# Patient Record
Sex: Female | Born: 1965 | Race: White | Hispanic: No | Smoking: Current every day smoker
Health system: Southern US, Community
[De-identification: ages and names within clinical notes are randomized; demographics above are authoritative.]

## PROBLEM LIST (undated history)

## (undated) DIAGNOSIS — F32A Depression, unspecified: Secondary | ICD-10-CM

## (undated) DIAGNOSIS — N200 Calculus of kidney: Secondary | ICD-10-CM

## (undated) DIAGNOSIS — F329 Major depressive disorder, single episode, unspecified: Secondary | ICD-10-CM

## (undated) DIAGNOSIS — Z9049 Acquired absence of other specified parts of digestive tract: Secondary | ICD-10-CM

## (undated) DIAGNOSIS — F909 Attention-deficit hyperactivity disorder, unspecified type: Secondary | ICD-10-CM

## (undated) HISTORY — PX: ABDOMINAL HYSTERECTOMY: SHX81

## (undated) HISTORY — PX: CHOLECYSTECTOMY: SHX55

---

## 2005-05-20 ENCOUNTER — Emergency Department (HOSPITAL_COMMUNITY): Admission: EM | Admit: 2005-05-20 | Discharge: 2005-05-20 | Payer: Self-pay | Admitting: Family Medicine

## 2010-11-20 ENCOUNTER — Observation Stay (HOSPITAL_COMMUNITY)
Admission: EM | Admit: 2010-11-20 | Discharge: 2010-11-22 | Disposition: A | Discharge status: Home / Self Care | Payer: Managed Care, Other (non HMO) | Source: Ambulatory Visit | Attending: Internal Medicine | Admitting: Internal Medicine

## 2010-11-20 ENCOUNTER — Inpatient Hospital Stay (INDEPENDENT_AMBULATORY_CARE_PROVIDER_SITE_OTHER)
Admission: RE | Admit: 2010-11-20 | Discharge: 2010-11-20 | Disposition: A | Discharge status: Home / Self Care | Payer: Managed Care, Other (non HMO) | Source: Ambulatory Visit | Attending: Family Medicine | Admitting: Family Medicine

## 2010-11-20 DIAGNOSIS — F411 Generalized anxiety disorder: Secondary | ICD-10-CM | POA: Insufficient documentation

## 2010-11-20 DIAGNOSIS — F3289 Other specified depressive episodes: Secondary | ICD-10-CM | POA: Insufficient documentation

## 2010-11-20 DIAGNOSIS — F329 Major depressive disorder, single episode, unspecified: Secondary | ICD-10-CM | POA: Insufficient documentation

## 2010-11-20 DIAGNOSIS — K219 Gastro-esophageal reflux disease without esophagitis: Secondary | ICD-10-CM | POA: Insufficient documentation

## 2010-11-20 DIAGNOSIS — F17200 Nicotine dependence, unspecified, uncomplicated: Secondary | ICD-10-CM | POA: Insufficient documentation

## 2010-11-20 DIAGNOSIS — E876 Hypokalemia: Secondary | ICD-10-CM | POA: Insufficient documentation

## 2010-11-20 DIAGNOSIS — R109 Unspecified abdominal pain: Secondary | ICD-10-CM | POA: Insufficient documentation

## 2010-11-20 DIAGNOSIS — R0789 Other chest pain: Principal | ICD-10-CM | POA: Insufficient documentation

## 2010-11-20 DIAGNOSIS — R079 Chest pain, unspecified: Secondary | ICD-10-CM

## 2010-11-20 DIAGNOSIS — Z23 Encounter for immunization: Secondary | ICD-10-CM | POA: Insufficient documentation

## 2010-11-20 DIAGNOSIS — E785 Hyperlipidemia, unspecified: Secondary | ICD-10-CM | POA: Insufficient documentation

## 2010-11-20 DIAGNOSIS — R51 Headache: Secondary | ICD-10-CM | POA: Insufficient documentation

## 2010-11-20 DIAGNOSIS — R0602 Shortness of breath: Secondary | ICD-10-CM | POA: Insufficient documentation

## 2010-11-20 DIAGNOSIS — Z87442 Personal history of urinary calculi: Secondary | ICD-10-CM | POA: Insufficient documentation

## 2010-11-20 HISTORY — DX: Acquired absence of other specified parts of digestive tract: Z90.49

## 2010-11-20 LAB — D-DIMER, QUANTITATIVE: D-Dimer, Quant: 0.24 ug/mL-FEU (ref 0.00–0.48)

## 2010-11-20 LAB — COMPREHENSIVE METABOLIC PANEL
ALT: 22 U/L (ref 0–35)
AST: 27 U/L (ref 0–37)
Albumin: 3.8 g/dL (ref 3.5–5.2)
Alkaline Phosphatase: 99 U/L (ref 39–117)
CO2: 24 mEq/L (ref 19–32)
Chloride: 99 mEq/L (ref 96–112)
Creatinine, Ser: 0.7 mg/dL (ref 0.50–1.10)
GFR calc non Af Amer: >90 mL/min (ref >90)
Potassium: 3.6 mEq/L (ref 3.5–5.1)
Sodium: 135 mEq/L (ref 135–145)
Total Bilirubin: 0.4 mg/dL (ref 0.3–1.2)

## 2010-11-20 LAB — URINALYSIS, ROUTINE W REFLEX MICROSCOPIC
Ketones, ur: NEGATIVE mg/dL
Nitrite: NEGATIVE
Protein, ur: 30 mg/dL — AB
Urobilinogen, UA: 0.2 mg/dL (ref 0.0–1.0)
pH: 5.5 (ref 5.0–8.0)

## 2010-11-20 LAB — POCT I-STAT TROPONIN I: Troponin i, poc: 0.01 ng/mL (ref 0.00–0.08)

## 2010-11-20 LAB — CBC
Hemoglobin: 14.5 g/dL (ref 12.0–15.0)
MCH: 33.6 pg (ref 26.0–34.0)
MCHC: 36.4 g/dL — ABNORMAL HIGH (ref 30.0–36.0)
Platelets: 326 10*3/uL (ref 150–400)
RBC: 4.31 MIL/uL (ref 3.87–5.11)

## 2010-11-20 LAB — DIFFERENTIAL
Basophils Absolute: 0 10*3/uL (ref 0.0–0.1)
Basophils Relative: 0 % (ref 0–1)
Eosinophils Absolute: 0.1 10*3/uL (ref 0.0–0.7)
Monocytes Absolute: 0.7 10*3/uL (ref 0.1–1.0)
Neutro Abs: 6.9 10*3/uL (ref 1.7–7.7)
Neutrophils Relative %: 63 % (ref 43–77)

## 2010-11-20 LAB — POCT I-STAT, CHEM 8
BUN: <3 mg/dL — ABNORMAL LOW (ref 6–23)
Calcium, Ion: 1.06 mmol/L — ABNORMAL LOW (ref 1.12–1.32)
Chloride: 102 mEq/L (ref 96–112)
Creatinine, Ser: 0.7 mg/dL (ref 0.50–1.10)
Glucose, Bld: 99 mg/dL (ref 70–99)
HCT: 44 % (ref 36.0–46.0)
Hemoglobin: 15 g/dL (ref 12.0–15.0)
Potassium: 3.5 mEq/L (ref 3.5–5.1)
Sodium: 137 mEq/L (ref 135–145)
TCO2: 24 mmol/L (ref 0–100)

## 2010-11-20 LAB — URINE MICROSCOPIC-ADD ON

## 2010-11-20 LAB — TROPONIN I: Troponin I: <0.3 ng/mL (ref <0.30)

## 2010-11-21 ENCOUNTER — Inpatient Hospital Stay (HOSPITAL_COMMUNITY): Payer: Managed Care, Other (non HMO)

## 2010-11-21 ENCOUNTER — Encounter (HOSPITAL_COMMUNITY): Payer: Self-pay | Admitting: Radiology

## 2010-11-21 LAB — COMPREHENSIVE METABOLIC PANEL
Alkaline Phosphatase: 105 U/L (ref 39–117)
BUN: 6 mg/dL (ref 6–23)
Chloride: 99 mEq/L (ref 96–112)
Creatinine, Ser: 0.64 mg/dL (ref 0.50–1.10)
GFR calc Af Amer: >90 mL/min (ref >90)
GFR calc non Af Amer: >90 mL/min (ref >90)
Glucose, Bld: 111 mg/dL — ABNORMAL HIGH (ref 70–99)
Potassium: 3.2 mEq/L — ABNORMAL LOW (ref 3.5–5.1)
Total Bilirubin: 0.3 mg/dL (ref 0.3–1.2)

## 2010-11-21 LAB — LIPID PANEL
Cholesterol: 269 mg/dL — ABNORMAL HIGH (ref 0–200)
Triglycerides: 223 mg/dL — ABNORMAL HIGH (ref <150)

## 2010-11-21 LAB — CBC
HCT: 39.2 % (ref 36.0–46.0)
Hemoglobin: 14 g/dL (ref 12.0–15.0)
MCHC: 35.7 g/dL (ref 30.0–36.0)
MCV: 93.3 fL (ref 78.0–100.0)
WBC: 10.5 10*3/uL (ref 4.0–10.5)

## 2010-11-21 LAB — CARDIAC PANEL(CRET KIN+CKTOT+MB+TROPI)
CK, MB: 1.5 ng/mL (ref 0.3–4.0)
Troponin I: <0.3 ng/mL (ref <0.30)

## 2010-11-21 LAB — DIFFERENTIAL
Basophils Absolute: 0 10*3/uL (ref 0.0–0.1)
Lymphocytes Relative: 34 % (ref 12–46)
Lymphs Abs: 3.6 10*3/uL (ref 0.7–4.0)
Monocytes Absolute: 0.7 10*3/uL (ref 0.1–1.0)
Neutro Abs: 6 10*3/uL (ref 1.7–7.7)

## 2010-11-21 LAB — HEMOGLOBIN A1C
Hgb A1c MFr Bld: 5.6 % (ref <5.7)
Mean Plasma Glucose: 114 mg/dL (ref <117)

## 2010-11-21 LAB — PRO B NATRIURETIC PEPTIDE: Pro B Natriuretic peptide (BNP): 57.2 pg/mL (ref 0–125)

## 2010-11-21 LAB — MAGNESIUM: Magnesium: 1.7 mg/dL (ref 1.5–2.5)

## 2010-11-22 LAB — BASIC METABOLIC PANEL
BUN: 3 mg/dL — ABNORMAL LOW (ref 6–23)
Chloride: 103 mEq/L (ref 96–112)
GFR calc Af Amer: >90 mL/min (ref >90)
GFR calc non Af Amer: >90 mL/min (ref >90)
Potassium: 3.7 mEq/L (ref 3.5–5.1)

## 2010-11-22 LAB — T4, FREE: Free T4: 1.14 ng/dL (ref 0.80–1.80)

## 2010-11-22 LAB — URINE CULTURE: Culture  Setup Time: 201210081053

## 2010-11-26 NOTE — H&P (Signed)
NAME:  Mia Smith, Mia Smith NO.:  [id number]  MEDICAL RECORD NO.:  [id number]  LOCATION:                                 FACILITY:  PHYSICIAN:  Lonia Blood, M.D.      DATE OF BIRTH:  December 17, 1965  DATE OF ADMISSION: DATE OF DISCHARGE:                             HISTORY & PHYSICAL   PRIMARY CARE PHYSICIAN:  In [place], she is unassigned to [place].  PRESENTING COMPLAINT:  Chest pain.  HISTORY OF PRESENT ILLNESS:  The patient is a 45 year old female who presented with sudden loss of left-sided chest pain associated with nausea and weakness.  She was apparently working in [employer] today when it happened.  It was sharp, centrally located.  Associated with some right flank pain that is persistent.  The patient had prior history of kidney stones and initially thought she was having another kidney stone attack.  She denied any shortness of breath.  Denied any orthopnea.  No PND.  Denied any hemoptysis.  She denied any diarrhea.  Her chest pain was relieved in the ED with nitro.  When she arrived to the ED, she was apparently having chest pain that is she rated as 8/10 but currently down to 0/10.  She could not identify specific aggravating factor but is relieved again with nitro.  She is currently having headaches from the nitro.  PAST MEDICAL HISTORY:  Significant for migraine headaches, depression.  ALLERGIES:  PENICILLIN.  MEDICATIONS:  Cymbalta and Vyvanse.  SOCIAL HISTORY:  She is married and lives in [place], [place]. Denied any alcohol or IV drug use.  The patient smokes about one and half pack per day.  No prior history of hypertension or coronary artery disease.  FAMILY HISTORY:  No history of early coronary artery disease.  REVIEW OF SYSTEMS:  All systems reviewed are negative except per HPI.  PHYSICAL EXAMINATION:  VITAL SIGNS:  She is afebrile with a temperature 97.6, her blood pressure is 136/77, pulse 106, respiratory rate 16, and saturation  99% on room air. GENERAL:  She is awake, alert, oriented, slightly overweight woman who is in no acute distress. HEENT:  PERRL.  EOMI.  No significant pallor.  No jaundice.  No rhinorrhea. NECK:  Supple.  No JVD.  No lymphadenopathy. RESPIRATORY:  Good air entry bilaterally.  No significant wheeze.  No crackles.  No rales. CARDIOVASCULAR:  She is tachycardic. ABDOMEN:  Soft, full, nontender with positive bowel sounds. EXTREMITIES:  No edema, cyanosis, or clubbing. SKIN:  No rashes.  No ulcers. MUSCULOSKELETAL:  No significant joint swelling or tenderness.  LABORATORY DATA:  Her white count is 10.8 with normal differential, hemoglobin of 14.5 with platelet count of 326.  Sodium is 135, potassium 3.6, chloride 99, CO2 of 24, glucose 105, BUN 5, creatinine 0.70. Calcium 9.1, albumin 3.8.  The rest of the LFTs within normal.  D-dimer is 0.24.  Urinalysis showed amber cloudy urine with positive leukocyte esterase with some bilirubin, trace blood, and proteinuria.  Urine microscopy showed many squamous epithelial cells with some hyaline casts, wbc's 3-6, and many bacteria.  Initial cardiac enzymes showed a negative troponin.  Her lipase is 28.  Chest x-ray  is currently negative.  EKG showed normal sinus rhythm with a rate of 81, normal intervals, no significant ST-T wave changes.  ASSESSMENT:  This is a 45 year old female presenting with chest pain. Her major risk factors are tobacco smoking and unknown cholesterol status.  PLAN: 1. Chest pain.  Seems atypical.  She has been seen by Dr. Jacinto Halim.     However, at this point, she does not seem to be having acute     coronary syndrome.  We will admit her for rule out.  There is     possibility of also kidney stone.  She had prior history of kidney     stone.  She is having right flank pain that seems spasmodic and     coming around to her groin.  We will work her up effectively then.     In this case, we will do CT scan of the abdomen with  kidney stone     protocol. 2. Tobacco abuse.  The patient is getting nicotine patch as well as     tobacco cessation counseling.  She is interested in getting Chantix     at the time of discharge. 3. Depression.  We will continue with her Cymbalta at her home dose. 4. Headaches.  More than likely from nitroglycerin.  We will treat her     symptomatically. 5. Leukocytosis.  Probably UTI.  I will empirically put her on     Rocephin or rather Cipro if she cannot tolerate Rocephin with her     penicillin allergies.     Lonia Blood, M.D.     Verlin Grills  D:  11/20/2010  T:  11/20/2010  Job:  [id number]  Electronically Signed by Lonia Blood M.D. on 11/26/2010 03:26:01 PM

## 2010-12-07 NOTE — Discharge Summary (Signed)
Mia Smith, Mia Smith              ACCOUNT NO.:  [id number]  MEDICAL RECORD NO.:  [id number]  LOCATION:                                 FACILITY:  PHYSICIAN:  Kathlen Mody, MD       DATE OF BIRTH:  1965-08-30  DATE OF ADMISSION:  11/20/2010 DATE OF DISCHARGE:  11/22/2010                              DISCHARGE SUMMARY   PRIMARY CARE PHYSICIAN:  None.  DISCHARGE DIAGNOSES: 1. Chest pain most likely secondary to anxiety. 2. Depression. 3. Gastroesophageal reflux disease. 4. History of nephrolithiasis. 5. Anxiety. 6. Depression. 7. Tobacco abuse. 8. Hyperlipidemia. 9. Migraine headaches.  DISCHARGE MEDICATIONS: 1. Aspirin 81 mg daily. 2. Nicotine patch. 3. Oxycodone 5 mg every 6 hours as needed. 4. Protonix 40 mg daily. 5. Simvastatin 20 mg daily. 6. Cymbalta 60 mg daily. 7. Vyvanse 30 mg 1 tab daily.  CONSULTATIONS:  Cardiology consult from Dr. Jacinto Halim.  PROCEDURES:  None.  PERTINENT LABS:  The patient had a CBC done which showed an elevated WBC count of 10.8.  Point-of-care troponin negative.  Urinalysis shows small leukocytes and many bacteria.  D-dimer was negative.  Comprehensive metabolic panel significant for glucose of 105, lipase was normal. Troponin negative.  Cardiac enzymes were negative.  Pro BNP was 57. Lipid profile showed an elevated total cholesterol and LDL and low HDL. Free T4 was 1.14.  TSH was 7.124.  Hemoglobin A1c was 5.62.  One sets of cardiac enzymes negative.  Urine culture showed no growth to date.  RADIOLOGY:  The patient had a CT abdomen pelvis without contrast shows mild hepatic steatosis.  Minimal increase in the size of the small left adrenal adenoma.  Two view chest x-ray shows no acute cardiopulmonary disease.  BRIEF HOSPITAL COURSE:  This 45 year old lady with history of anxiety and depression came in with sudden onset of left-sided chest pain.  She was admitted to tele to rule out acute coronary syndrome.  She was also complaining  of right flank pain and a CT abdomen pelvis was done to evaluate for renal stones.  CT abdomen pelvis did not show any kidney stones.  Her tele monitor was within normal limits.  Her enzymes were negative.  A 2D echocardiogram was done, which showed good systolic left ventricular systolic function with no regional wall abnormalities. Possible grade 1 diastolic dysfunction with elevated mildly dilated left atrium.  Hence, she was discharged home to follow with Dr. Jacinto Halim and to follow up the echo results and also possibly recommended to get an outpatient stress test if the patient's chest pain is persistent.  Leukocytosis, she was worked up for urinary tract infection.  She was empirically started on Rocephin and urine cultures came negative.  Her antibiotics were discontinued.  Tobacco abuse.  She was given tobacco cessation counseling.  She was put on nicotine patch.  Initially, she was interested in getting Chantix, but she was not sure if her insurance would cover that, hence she was discharged on nicotine patch.  Depression, no suicidal ideation.  Continue with her Cymbalta.  On the day of discharge, the patient's vitals were within normal limits. Her exam was within normal limits.  She was discharged home  to follow up with her cardiologist for an outpatient stress test and also follow with her PCP for her left adrenal small   DICTATION ENDS AT THIS POINT.          ______________________________ Kathlen Mody, MD     [facility]  D:  12/06/2010  T:  12/06/2010  Job:  [id number]  Electronically Signed by Kathlen Mody MD on 12/07/2010 11:11:51 PM

## 2010-12-09 NOTE — H&P (Signed)
NAMEOLIVIAGRACE, CRISANTI NO.:  [id number]  MEDICAL RECORD NO.:  [id number]  LOCATION:  3731                         FACILITY:  [facility]  PHYSICIAN:  Pamella Pert, MD DATE OF BIRTH:  [birth year]  DATE OF ADMISSION:  11/20/2010 DATE OF DISCHARGE:                             HISTORY & PHYSICAL   ADMISSION DIAGNOSES: 1. Chest pain, rule out unstable angina. 2. Depression. 3. Gastroesophageal reflux disease. 4. History of nephrolithiasis. 5. Anxiety and depression. 6. Tobacco use disorder. 7. Hyperlipidemia.  HISTORY:  Ms. Stephane Junkins is a 45 year old female who woke up this morning, not feeling well, and felt nauseous all day.  This afternoon, she went to a mall around 3 o'clock.  At that time, she felt a heaviness in the middle of her chest.  It was continuous.  It worsen with taking deep breaths.  However, because of continued at this chest discomfort, she presented to the emergency room.  She describes chest discomfort as pressure-like sensation in the middle of her chest.  She also describes pain in the upper middle part of her abdomen.  She has had nausea all day.  No associated diaphoresis or palpitations.  No hemoptysis.  No shortness of breath, although while sitting and waiting in the emergency department she did feel a little bit short of breath.  She also complains of lower back pain.  It is in the middle of her back below the scapula.  She states it is continuous.  She also complains of upper abdominal discomfort.  She states it is continuous.  REVIEW OF SYSTEMS:  She is not a diabetic.  There is no bowel or bladder distress except for feeling nausea.  She is not firm.  There is no history of syncope, although she has occasionally felt dizzy this all day today.  No hemoptysis.  No symptoms suggestive of TIA.  She now has developed severe headache, but no significant change in her chest pain with nitroglycerin.  Other systems are  negative.  HOME MEDICATION:  Cymbalta 60 mg p.o. daily for depression.  She also takes Vyvanse for ADHD.  ALLERGIES:  PENICILLIN as a child.  She was told that she swells up.  PAST SURGICAL HISTORY:  C-section in 1998, hysterectomy in 2004, and cholecystectomy in 2003.  SOCIAL HISTORY:  She is married, smokes about a pack and half to two packs of cigarettes per day.  Does not exercise up.  She has been under extreme stress.  No history of alcohol or illicit drug use.  FAMILY HISTORY:  There is no history of premature coronary artery disease or diabetes in the family.  PHYSICAL EXAMINATION:  GENERAL:  She is moderately built and obese.  She appears to be in no acute distress. VITAL SIGNS:  Heart rate of 98 beats per minute, respirations 14, blood pressure 127/76. CARDIAC:  S1 and S2 is normal without any gallop or murmur. CHEST:  Clear. ABDOMEN:  Soft and obese.  There is mild epigastric tenderness present. BACK:  Examination of the spine revealed mild tenderness in the lower part of her back at T12-L1 level. NEUROLOGIC:  She was intact without any gross deficits.  Peripheral arterial examination was  normal with equal pulses bilaterally.  EKG demonstrates normal sinus rhythm with nonspecific T-wave changes. Her BMP was within normal limits except for mildly elevated blood sugar 105.  BUN of 5, creatinine 0.7.  Bilirubin was normal.  Lipase was normal.  Her urinalysis was within normal limits except for occasional wbc's and many bacteria.  She had trace hemoglobin in her urine.  Troponins were negative x1 along with D-dimer which is negative.  CBC was within normal limits.  The patient admitted the hospital overnight for observation.  I also obtained a urinalysis as she has had abdominal discomfort, nausea, vomiting, and this could be all nonspecific symptoms of urinary tract infection.  She may also have significant GERD and that may be giving her chest discomfort.  She has  history of urolithiasis and that she had passed stones sometime back in the past.  She felt that she may be having another stone about 10 days ago but she felt that she probably passed the stone.  Further recommendations,  we will follow.  Defer cardiac markers are negative and she is asymptomatic.  She is ruled out for myocardial infarction and even go home in the morning and do outpatient evaluation for cardiac risk ratification.  I have discussed with her regarding smoking cessation.     Pamella Pert, MD     JR[facility]/MEDQ  D:  11/20/2010  T:  11/21/2010  Job:  [id number]  cc:   Keturah Barre, MD  Electronically Signed by Yates Decamp MD on 12/09/2010 05:48:34 PM

## 2012-04-17 ENCOUNTER — Emergency Department (HOSPITAL_BASED_OUTPATIENT_CLINIC_OR_DEPARTMENT_OTHER)
Admission: EM | Admit: 2012-04-17 | Discharge: 2012-04-17 | Disposition: A | Discharge status: Home / Self Care | Payer: BC Managed Care – PPO | Attending: Emergency Medicine | Admitting: Emergency Medicine

## 2012-04-17 ENCOUNTER — Encounter (HOSPITAL_BASED_OUTPATIENT_CLINIC_OR_DEPARTMENT_OTHER): Payer: Self-pay | Admitting: *Deleted

## 2012-04-17 ENCOUNTER — Emergency Department (HOSPITAL_BASED_OUTPATIENT_CLINIC_OR_DEPARTMENT_OTHER): Payer: BC Managed Care – PPO

## 2012-04-17 DIAGNOSIS — R110 Nausea: Secondary | ICD-10-CM | POA: Insufficient documentation

## 2012-04-17 DIAGNOSIS — Z87442 Personal history of urinary calculi: Secondary | ICD-10-CM | POA: Insufficient documentation

## 2012-04-17 DIAGNOSIS — F3289 Other specified depressive episodes: Secondary | ICD-10-CM | POA: Insufficient documentation

## 2012-04-17 DIAGNOSIS — R109 Unspecified abdominal pain: Secondary | ICD-10-CM

## 2012-04-17 DIAGNOSIS — Z87440 Personal history of urinary (tract) infections: Secondary | ICD-10-CM | POA: Insufficient documentation

## 2012-04-17 DIAGNOSIS — F909 Attention-deficit hyperactivity disorder, unspecified type: Secondary | ICD-10-CM | POA: Insufficient documentation

## 2012-04-17 DIAGNOSIS — R310 Gross hematuria: Secondary | ICD-10-CM | POA: Insufficient documentation

## 2012-04-17 DIAGNOSIS — F329 Major depressive disorder, single episode, unspecified: Secondary | ICD-10-CM | POA: Insufficient documentation

## 2012-04-17 DIAGNOSIS — F17200 Nicotine dependence, unspecified, uncomplicated: Secondary | ICD-10-CM | POA: Insufficient documentation

## 2012-04-17 DIAGNOSIS — Z90710 Acquired absence of both cervix and uterus: Secondary | ICD-10-CM | POA: Insufficient documentation

## 2012-04-17 DIAGNOSIS — Z9089 Acquired absence of other organs: Secondary | ICD-10-CM | POA: Insufficient documentation

## 2012-04-17 DIAGNOSIS — Z79899 Other long term (current) drug therapy: Secondary | ICD-10-CM | POA: Insufficient documentation

## 2012-04-17 HISTORY — DX: Attention-deficit hyperactivity disorder, unspecified type: F90.9

## 2012-04-17 HISTORY — DX: Depression, unspecified: F32.A

## 2012-04-17 HISTORY — DX: Calculus of kidney: N20.0

## 2012-04-17 HISTORY — DX: Major depressive disorder, single episode, unspecified: F32.9

## 2012-04-17 LAB — URINE MICROSCOPIC-ADD ON

## 2012-04-17 LAB — URINALYSIS, ROUTINE W REFLEX MICROSCOPIC
Bilirubin Urine: NEGATIVE
Glucose, UA: NEGATIVE mg/dL
Leukocytes, UA: NEGATIVE
Nitrite: NEGATIVE
Specific Gravity, Urine: 1.004 — ABNORMAL LOW (ref 1.005–1.030)
pH: 6 (ref 5.0–8.0)

## 2012-04-17 MED ORDER — SODIUM CHLORIDE 0.9 % IV SOLN
INTRAVENOUS | Status: DC
  Administered 2012-04-17: 02:00:00 via INTRAVENOUS

## 2012-04-17 MED ORDER — KETOROLAC TROMETHAMINE 15 MG/ML IJ SOLN
15.0000 mg | Freq: Once | INTRAMUSCULAR | Status: AC
  Administered 2012-04-17: 15 mg via INTRAVENOUS
  Filled 2012-04-17: qty 1

## 2012-04-17 MED ORDER — PROMETHAZINE HCL 25 MG/ML IJ SOLN
12.5000 mg | Freq: Once | INTRAMUSCULAR | Status: AC
  Administered 2012-04-17: 12.5 mg via INTRAVENOUS
  Filled 2012-04-17: qty 1

## 2012-04-17 MED ORDER — HYDROMORPHONE HCL PF 1 MG/ML IJ SOLN
1.0000 mg | Freq: Once | INTRAMUSCULAR | Status: AC
  Administered 2012-04-17: 1 mg via INTRAVENOUS
  Filled 2012-04-17: qty 1

## 2012-04-17 MED ORDER — ONDANSETRON HCL 4 MG/2ML IJ SOLN
4.0000 mg | Freq: Once | INTRAMUSCULAR | Status: AC
  Administered 2012-04-17: 4 mg via INTRAVENOUS
  Filled 2012-04-17: qty 2

## 2012-04-17 NOTE — ED Notes (Signed)
dx'd with UTI last week and started on Macrobid but it makes pt sick on her stomach. Pt now c/o right flank pain that radiates to groin area.

## 2012-04-17 NOTE — ED Provider Notes (Signed)
History     CSN: [id number]  Arrival date & time 04/17/12  0118   First MD Initiated Contact with Patient 04/17/12 0200      Chief Complaint  Patient presents with  . Flank Pain    (Consider location/radiation/quality/duration/timing/severity/associated sxs/prior treatment) HPI This is a 46 year old female with a history of urinary tract infections and kidney stones. She was seen by her primary care physician states he does do to right flank pain and gross hematuria which were consistent with prior kidney infections. She was started on Macrobid but had to discontinue this due to to nausea and vomiting. She was then treated with Pyridium but again stopped this due to nausea and vomiting. Yesterday her symptoms worsened and became severe this morning. Her symptoms now are characterized as more like a kidney stone. The pain is located in the right flank radiating around to the right groin. It is not significantly change with movement or palpation. She continues to be nauseated. She is no longer having gross hematuria. She denies fever but has had chills.  Past Medical History  Diagnosis Date  . S/P laparoscopic cholecystectomy   . Kidney stones   . ADHD (attention deficit hyperactivity disorder)   . Depression     Past Surgical History  Procedure Laterality Date  . Cholecystectomy    . Abdominal hysterectomy      History reviewed. No pertinent family history.  History  Substance Use Topics  . Smoking status: Current Every Day Smoker  . Smokeless tobacco: Not on file  . Alcohol Use: No    OB History   Grav Para Term Preterm Abortions TAB SAB Ect Mult Living                  Review of Systems  All other systems reviewed and are negative.    Allergies  Ciprofloxacin; Macrobid; and Penicillins  Home Medications   Current Outpatient Rx  Name  Route  Sig  Dispense  Refill  . DULoxetine (CYMBALTA) 60 MG capsule   Oral   Take 60 mg by mouth daily.         Marland Kitchen  lisdexamfetamine (VYVANSE) 40 MG capsule   Oral   Take 40 mg by mouth every morning.         . phenazopyridine (PYRIDIUM) 100 MG tablet   Oral   Take 100 mg by mouth 3 (three) times daily as needed for pain.           BP 123/98  Pulse 80  Temp(Src) 97.6 F (36.4 C) (Oral)  Resp 18  Ht 5\' 7"  (1.702 m)  Wt 185 lb (83.915 kg)  BMI 28.97 kg/m2  SpO2 100%  Physical Exam General: Well-developed, well-nourished female in no acute distress; appearance consistent with age of record HENT: normocephalic, atraumatic Eyes: pupils equal round and reactive to light; extraocular muscles intact Neck: supple Heart: regular rate and rhythm Lungs: clear to auscultation bilaterally Abdomen: soft; nondistended; nontender; bowel sounds present GU: Mild right CVA tenderness Extremities: No deformity; full range of motion; pulses normal Neurologic: Awake, alert and oriented; motor function intact in all extremities and symmetric; no facial droop Skin: Warm and dry Psychiatric: Normal mood and affect    ED Course  Procedures (including critical care time)     MDM   Nursing notes and vitals signs, including pulse oximetry, reviewed.  Summary of this visit's results, reviewed by myself:  Labs:  Results for orders placed during the hospital encounter of 04/17/12 (from  the past 24 hour(s))  URINALYSIS, ROUTINE W REFLEX MICROSCOPIC     Status: Abnormal   Collection Time    04/17/12  1:28 AM      Result Value Range   Color, Urine YELLOW  YELLOW   APPearance CLEAR  CLEAR   Specific Gravity, Urine 1.004 (*) 1.005 - 1.030   pH 6.0  5.0 - 8.0   Glucose, UA NEGATIVE  NEGATIVE mg/dL   Hgb urine dipstick TRACE (*) NEGATIVE   Bilirubin Urine NEGATIVE  NEGATIVE   Ketones, ur NEGATIVE  NEGATIVE mg/dL   Protein, ur NEGATIVE  NEGATIVE mg/dL   Urobilinogen, UA 0.2  0.0 - 1.0 mg/dL   Nitrite NEGATIVE  NEGATIVE   Leukocytes, UA NEGATIVE  NEGATIVE  URINE MICROSCOPIC-ADD ON     Status: None    Collection Time    04/17/12  1:28 AM      Result Value Range   Squamous Epithelial / LPF RARE  RARE   WBC, UA 0-2  <3 WBC/hpf   RBC / HPF 0-2  <3 RBC/hpf   Bacteria, UA RARE  RARE    Imaging Studies: Ct Abdomen Pelvis Wo Contrast  04/17/2012  *RADIOLOGY REPORT*  Clinical Data: Right flank pain and right-sided abdominal pain.  CT ABDOMEN AND PELVIS WITHOUT CONTRAST  Technique:  Multidetector CT imaging of the abdomen and pelvis was performed following the standard protocol without intravenous contrast.  Comparison: CT of the abdomen and pelvis performed 12/23/2010  Findings: The visualized lung bases are clear.  Mild diffuse fatty infiltration is noted within the liver.  The liver and spleen are otherwise unremarkable in appearance.  The patient is status post cholecystectomy, with clips noted at the gallbladder fossa.  A 2.0 cm left adrenal adenoma is noted.  The right adrenal gland is unremarkable in appearance.  The pancreas is within normal limits.  The kidneys are unremarkable in appearance.  There is no evidence of hydronephrosis.  No renal or ureteral stones are seen.  No perinephric stranding is appreciated.  No free fluid is identified.  The small bowel is unremarkable in appearance.  The stomach is within normal limits.  No acute vascular abnormalities are seen.  Mild scattered calcification is noted along the distal abdominal aorta and its branches.  The appendix is normal in caliber, without evidence for appendicitis.  The colon is unremarkable in appearance.  The bladder is mild distended and grossly unremarkable.  A small urachal remnant is incidentally noted.  The patient is status post hysterectomy.  No suspicious adnexal masses are seen.  No inguinal lymphadenopathy is seen.  No acute osseous abnormalities are identified.  IMPRESSION:  1.  No acute abnormality seen within the abdomen or pelvis. 2.  Mild diffuse fatty infiltration within the liver. 3.  2.0 cm left adrenal adenoma  incidentally noted. 4.  Mild scattered calcification along the distal abdominal aorta and its branches.   Original Report Authenticated By: Tonia Ghent, M.D.    4:10 AM Patient denies pain at this time. She is somnolent after 2.5 mg of Phenergan IV. We will continue to observe her.  4:49 AM Patient still pain free. Her pain may have represented a passed stone. There is no evidence of an acute ureteral stone or urinary tract infection.          Hanley Seamen, MD 04/17/12 [phone]

## 2012-07-04 ENCOUNTER — Encounter (HOSPITAL_COMMUNITY): Payer: Self-pay | Admitting: Emergency Medicine

## 2012-07-04 ENCOUNTER — Encounter (HOSPITAL_COMMUNITY)
Admission: EM | Disposition: A | Discharge status: Home / Self Care | Payer: Self-pay | Source: Home / Self Care | Attending: Emergency Medicine

## 2012-07-04 ENCOUNTER — Other Ambulatory Visit: Payer: Self-pay

## 2012-07-04 ENCOUNTER — Observation Stay (HOSPITAL_COMMUNITY)
Admission: EM | Admit: 2012-07-04 | Discharge: 2012-07-04 | Disposition: A | Discharge status: Home / Self Care | Payer: BC Managed Care – PPO | Attending: Emergency Medicine | Admitting: Emergency Medicine

## 2012-07-04 ENCOUNTER — Emergency Department (HOSPITAL_COMMUNITY): Payer: BC Managed Care – PPO

## 2012-07-04 DIAGNOSIS — F17200 Nicotine dependence, unspecified, uncomplicated: Secondary | ICD-10-CM | POA: Insufficient documentation

## 2012-07-04 DIAGNOSIS — R110 Nausea: Secondary | ICD-10-CM | POA: Insufficient documentation

## 2012-07-04 DIAGNOSIS — K219 Gastro-esophageal reflux disease without esophagitis: Secondary | ICD-10-CM | POA: Insufficient documentation

## 2012-07-04 DIAGNOSIS — R079 Chest pain, unspecified: Principal | ICD-10-CM | POA: Insufficient documentation

## 2012-07-04 DIAGNOSIS — I2510 Atherosclerotic heart disease of native coronary artery without angina pectoris: Secondary | ICD-10-CM | POA: Insufficient documentation

## 2012-07-04 DIAGNOSIS — R0602 Shortness of breath: Secondary | ICD-10-CM | POA: Insufficient documentation

## 2012-07-04 DIAGNOSIS — E785 Hyperlipidemia, unspecified: Secondary | ICD-10-CM | POA: Insufficient documentation

## 2012-07-04 HISTORY — PX: LEFT HEART CATHETERIZATION WITH CORONARY ANGIOGRAM: SHX5451

## 2012-07-04 LAB — CBC WITH DIFFERENTIAL/PLATELET
Basophils Absolute: 0 10*3/uL (ref 0.0–0.1)
Basophils Relative: 0 % (ref 0–1)
Eosinophils Absolute: 0.3 10*3/uL (ref 0.0–0.7)
MCH: 33.2 pg (ref 26.0–34.0)
MCHC: 35.5 g/dL (ref 30.0–36.0)
Neutro Abs: 4.7 10*3/uL (ref 1.7–7.7)
Neutrophils Relative %: 52 % (ref 43–77)
Platelets: 273 10*3/uL (ref 150–400)
RDW: 14.3 % (ref 11.5–15.5)

## 2012-07-04 LAB — CBC
HCT: 33.7 % — ABNORMAL LOW (ref 36.0–46.0)
Hemoglobin: 11.8 g/dL — ABNORMAL LOW (ref 12.0–15.0)
MCH: 33 pg (ref 26.0–34.0)
MCHC: 35 g/dL (ref 30.0–36.0)
MCV: 94.1 fL (ref 78.0–100.0)
RDW: 14.5 % (ref 11.5–15.5)

## 2012-07-04 LAB — POCT I-STAT, CHEM 8
Chloride: 100 mEq/L (ref 96–112)
Glucose, Bld: 103 mg/dL — ABNORMAL HIGH (ref 70–99)
HCT: 36 % (ref 36.0–46.0)
Hemoglobin: 12.2 g/dL (ref 12.0–15.0)
Potassium: 3.9 mEq/L (ref 3.5–5.1)
Sodium: 138 mEq/L (ref 135–145)

## 2012-07-04 LAB — LIPID PANEL
LDL Cholesterol: 173 mg/dL — ABNORMAL HIGH (ref 0–99)
VLDL: 46 mg/dL — ABNORMAL HIGH (ref 0–40)

## 2012-07-04 LAB — URINALYSIS, ROUTINE W REFLEX MICROSCOPIC
Glucose, UA: NEGATIVE mg/dL
Ketones, ur: NEGATIVE mg/dL
Leukocytes, UA: NEGATIVE
Protein, ur: NEGATIVE mg/dL
Urobilinogen, UA: 0.2 mg/dL (ref 0.0–1.0)

## 2012-07-04 LAB — POCT I-STAT TROPONIN I: Troponin i, poc: 0 ng/mL (ref 0.00–0.08)

## 2012-07-04 SURGERY — LEFT HEART CATHETERIZATION WITH CORONARY ANGIOGRAM
Anesthesia: LOCAL

## 2012-07-04 MED ORDER — ONDANSETRON HCL 4 MG/2ML IJ SOLN
4.0000 mg | Freq: Once | INTRAMUSCULAR | Status: AC
  Administered 2012-07-04: 4 mg via INTRAVENOUS
  Filled 2012-07-04: qty 2

## 2012-07-04 MED ORDER — NITROGLYCERIN 1 MG/10 ML FOR IR/CATH LAB
INTRA_ARTERIAL | Status: AC
  Filled 2012-07-04: qty 10

## 2012-07-04 MED ORDER — SODIUM CHLORIDE 0.9 % IV SOLN: 1.0000 mL/kg/h | INTRAVENOUS | Status: DC

## 2012-07-04 MED ORDER — HYDROMORPHONE HCL PF 2 MG/ML IJ SOLN
0.5000 mg | Freq: Once | INTRAMUSCULAR | Status: AC
  Administered 2012-07-04: 0.5 mg via INTRAVENOUS

## 2012-07-04 MED ORDER — PROMETHAZINE HCL 25 MG/ML IJ SOLN
25.0000 mg | Freq: Once | INTRAMUSCULAR | Status: AC
  Administered 2012-07-04: 25 mg via INTRAVENOUS
  Filled 2012-07-04: qty 1

## 2012-07-04 MED ORDER — HYDROMORPHONE HCL PF 2 MG/ML IJ SOLN
INTRAMUSCULAR | Status: AC
  Filled 2012-07-04: qty 1

## 2012-07-04 MED ORDER — DIPHENHYDRAMINE HCL 50 MG/ML IJ SOLN
12.5000 mg | Freq: Once | INTRAMUSCULAR | Status: AC
  Administered 2012-07-04: 12.5 mg via INTRAVENOUS
  Filled 2012-07-04: qty 1

## 2012-07-04 MED ORDER — HEPARIN SODIUM (PORCINE) 1000 UNIT/ML IJ SOLN
INTRAMUSCULAR | Status: AC
  Filled 2012-07-04: qty 1

## 2012-07-04 MED ORDER — HEPARIN BOLUS VIA INFUSION: 4000.0000 [IU] | Freq: Once | INTRAVENOUS | Status: DC

## 2012-07-04 MED ORDER — DEXAMETHASONE SODIUM PHOSPHATE 10 MG/ML IJ SOLN
10.0000 mg | Freq: Once | INTRAMUSCULAR | Status: AC
  Administered 2012-07-04: 10 mg via INTRAVENOUS
  Filled 2012-07-04: qty 1

## 2012-07-04 MED ORDER — MORPHINE SULFATE 4 MG/ML IJ SOLN
4.0000 mg | Freq: Once | INTRAMUSCULAR | Status: AC
  Administered 2012-07-04: 4 mg via INTRAVENOUS
  Filled 2012-07-04: qty 1

## 2012-07-04 MED ORDER — LIDOCAINE HCL (PF) 1 % IJ SOLN
INTRAMUSCULAR | Status: AC
  Filled 2012-07-04: qty 30

## 2012-07-04 MED ORDER — ONDANSETRON HCL 4 MG/2ML IJ SOLN: 4.0000 mg | Freq: Four times a day (QID) | INTRAMUSCULAR | Status: DC | PRN

## 2012-07-04 MED ORDER — MORPHINE SULFATE 2 MG/ML IJ SOLN: 4.0000 mg | INTRAMUSCULAR | Status: DC | PRN

## 2012-07-04 MED ORDER — ACETAMINOPHEN 325 MG PO TABS
650.0000 mg | ORAL_TABLET | ORAL | Status: DC | PRN
  Administered 2012-07-04: 650 mg via ORAL

## 2012-07-04 MED ORDER — HEPARIN (PORCINE) IN NACL 2-0.9 UNIT/ML-% IJ SOLN
INTRAMUSCULAR | Status: AC
  Filled 2012-07-04: qty 1000

## 2012-07-04 MED ORDER — PANTOPRAZOLE SODIUM 40 MG PO TBEC: 40.0000 mg | DELAYED_RELEASE_TABLET | Freq: Every day | ORAL | Status: AC

## 2012-07-04 MED ORDER — VERAPAMIL HCL 2.5 MG/ML IV SOLN
INTRAVENOUS | Status: AC
  Filled 2012-07-04: qty 2

## 2012-07-04 MED ORDER — ATORVASTATIN CALCIUM 40 MG PO TABS: 40.0000 mg | ORAL_TABLET | Freq: Every day | ORAL | Status: AC

## 2012-07-04 MED ORDER — ACETAMINOPHEN 325 MG PO TABS
ORAL_TABLET | ORAL | Status: AC
  Filled 2012-07-04: qty 2

## 2012-07-04 MED ORDER — HEPARIN (PORCINE) IN NACL 100-0.45 UNIT/ML-% IJ SOLN
1000.0000 [IU]/h | INTRAMUSCULAR | Status: DC
  Filled 2012-07-04 (×7): qty 250

## 2012-07-04 NOTE — ED Notes (Signed)
Pt states she started having chest pain tonight about 2 hours ago. Pt states it feels like an elephant is on her chest and pressure on the left side. EMS states they gave patient 2 Nitros and pain decreased to a 4. Pt states she took 1 ASA at home. Pt states she had this pain feeling some time ago. Pt states she is also having a migraine. Pt has a 20 g IV in the left hand. Patient states she is having pressure in her chest at the moment 4/10.

## 2012-07-04 NOTE — Progress Notes (Signed)
ANTICOAGULATION CONSULT NOTE - Initial Consult  Pharmacy Consult for Heparin Indication: chest pain/ACS  Allergies  Allergen Reactions  . Penicillins Anaphylaxis  . Ciprofloxacin Hives and Itching  . Ibuprofen Other (See Comments)    Upsets my stomach  . Macrobid (Nitrofurantoin Macrocrystal) Other (See Comments)    Unknown   . Vyvanse (Lisdexamfetamine Dimesylate) Other (See Comments)    Makes me feel jittery    Patient Measurements: Height: 5' 6.93" (170 cm) Weight: 185 lb 3 oz (84 kg) IBW/kg (Calculated) : 61.44  Vital Signs: Temp: 98.2 F (36.8 C) (05/22 0326) Temp src: Oral (05/22 0326) BP: 127/62 mmHg (05/22 0326) Pulse Rate: 80 (05/22 0326)  Labs:  Recent Labs  07/04/12 0345 07/04/12 0359  HGB 12.3 12.2  HCT 34.6* 36.0  PLT 273  --   CREATININE  --  0.70    Estimated Creatinine Clearance: 97.7 ml/min (by C-G formula based on Cr of 0.7).   Medical History: Past Medical History  Diagnosis Date  . S/P laparoscopic cholecystectomy   . Kidney stones   . ADHD (attention deficit hyperactivity disorder)   . Depression     Medications:  Cymbalta  Vyvanse  Assessment: 47 y.o. female with chest pain for heparin  Goal of Therapy:  Heparin level 0.3-0.7 units/ml Monitor platelets by anticoagulation protocol: Yes   Plan:  Heparin 4000 units IV bolus, then 1000 units/hr Check heparin level in 6 hours.  Mia Smith 07/04/2012,6:07 AM

## 2012-07-04 NOTE — ED Provider Notes (Signed)
History     CSN: [id number]  Arrival date & time 07/04/12  2952   First MD Initiated Contact with Patient 07/04/12 0424      Chief Complaint  Patient presents with  . Chest Pain    (Consider location/radiation/quality/duration/timing/severity/associated sxs/prior treatment) HPI History provided by pt.   Pt presents w/ multiple complaints.  Developed heaviness, described as an elephant sitting on her chest, while sitting on cough at 1am today.  Non-radiating, intermittent, associated w/ SOB and nausea.  No recent fever, cough, abd pain.  No recent trauma/heavy lifting.  Had similar pain when she was admitted to the hospital in 2012 for "mild heart attack".  Also c/o headache x 2 days.  H/o migraines but this pain feels different.  Frontal.  No associated sx.  Denies trauma.  Past Medical History  Diagnosis Date  . S/P laparoscopic cholecystectomy   . Kidney stones   . ADHD (attention deficit hyperactivity disorder)   . Depression     Past Surgical History  Procedure Laterality Date  . Cholecystectomy    . Abdominal hysterectomy      No family history on file.  History  Substance Use Topics  . Smoking status: Current Every Day Smoker  . Smokeless tobacco: Not on file  . Alcohol Use: No    OB History   Grav Para Term Preterm Abortions TAB SAB Ect Mult Living                  Review of Systems  All other systems reviewed and are negative.    Allergies  Penicillins; Ciprofloxacin; Ibuprofen; Macrobid; and Vyvanse  Home Medications   Current Outpatient Rx  Name  Route  Sig  Dispense  Refill  . DULoxetine (CYMBALTA) 60 MG capsule   Oral   Take 60 mg by mouth daily.         Marland Kitchen lisdexamfetamine (VYVANSE) 40 MG capsule   Oral   Take 40 mg by mouth every morning.           BP 127/62  Pulse 80  Temp(Src) 98.2 F (36.8 C) (Oral)  Resp 22  SpO2 97%  Physical Exam  Nursing note and vitals reviewed. Constitutional: She is oriented to person, place, and  time. She appears well-developed and well-nourished.  Pt does not appear uncomfortable.   HENT:  Head: Normocephalic and atraumatic.  No tenderness of sinuses or temples.   Eyes:  Normal appearance  Neck: Normal range of motion. Neck supple. No rigidity. No Brudzinski's sign and no Kernig's sign noted.  Cardiovascular: Normal rate, regular rhythm and intact distal pulses.   Pulmonary/Chest: Effort normal and breath sounds normal. She exhibits no tenderness.  No pleuritic pain reported  Musculoskeletal: Normal range of motion.  Neurological: She is alert and oriented to person, place, and time. No sensory deficit. Coordination normal.  CN 3-12 intact.  No nystagmus.  5/5 and equal upper and lower extremity strength.  No past pointing.  Nml gait.   Skin: Skin is warm and dry. No rash noted.  Psychiatric: She has a normal mood and affect. Her behavior is normal.    ED Course  Procedures (including critical care time)   Date: 07/04/2012  Rate: 76  Rhythm: normal sinus rhythm  QRS Axis: normal  Intervals: normal  ST/T Wave abnormalities: normal  Conduction Disutrbances:none  Narrative Interpretation:   Old EKG Reviewed: unchanged   Labs Reviewed  CBC WITH DIFFERENTIAL - Abnormal; Notable for the following:  RBC 3.70 (*)    HCT 34.6 (*)    All other components within normal limits  POCT I-STAT, CHEM 8 - Abnormal; Notable for the following:    BUN 3 (*)    Glucose, Bld 103 (*)    All other components within normal limits  URINALYSIS, ROUTINE W REFLEX MICROSCOPIC  POCT I-STAT TROPONIN I   Dg Chest 2 View  07/04/2012   *RADIOLOGY REPORT*  Clinical Data: Left-sided chest pain.  CHEST - 2 VIEW  Comparison: 11/21/2010.  Findings: The cardiac silhouette, mediastinal and hilar contours are normal and stable.  The lungs are clear.  No pleural effusion. The bony thorax is intact.  IMPRESSION: No acute cardiopulmonary findings.   Original Report Authenticated By: Rudie Meyer, M.D.      1. Chest pain       MDM  [phone] F presents w/ multiple complaints.  Has had CP since 1am.  Reports admission for "mild heart attack" in 2012, though discharge diagnosis was chest pain secondary to anxiety.   Typical and atypical features of pain.  No significant exam findings.  EKG non-ischemic.  First troponin neg and second pending.  Labs otherwise unremarkable and all have been discussed w/ patient.  She has received IV morphine for pain (did not tolerate ntg en route) and continues to experience pressure.  Dr. Charleston Poot consulted and will admit.  Requests heparin.  6:05 AM        Otilio Miu, PA-C 07/04/12 1191  Otilio Miu, PA-C 07/04/12 [phone]

## 2012-07-04 NOTE — Interval H&P Note (Signed)
History and Physical Interval Note:  07/04/2012 7:38 AM  Mia Smith  has presented today for surgery, with the diagnosis of cp  The various methods of treatment have been discussed with the patient and family. After consideration of risks, benefits and other options for treatment, the patient has consented to  Procedure(s): LEFT HEART CATHETERIZATION WITH CORONARY ANGIOGRAM (N/A) and possible PTCAas a surgical intervention .  The patient's history has been reviewed, patient examined, no change in status, stable for surgery.  I have reviewed the patient's chart and labs.  Questions were answered to the patient's satisfaction.     Pamella Pert

## 2012-07-04 NOTE — ED Notes (Signed)
Pt. C/o chest pressure. Given 2 nitro with ems which decreased pain. C/o HA and nausea. Per pt, hx of "mild MI" and states symptoms are similar.

## 2012-07-04 NOTE — H&P (Signed)
Mia Smith is an 47 y.o. female.   Chief Complaint: Chest pain HPI:  Mia Smith is a 47 year old Caucasian female with history of hyperlipidemia, one pack of cigarette per day use, who I had seen in 2012 when she presented with chest pain and heaviness in the middle of the chest. She was evaluated in the emergency room, discharged home after ruling out myocardial infarction with normal EKG. She had been doing well and last night around 1 or 2 AM, she started having chest heaviness along with shortness of breath and nausea. She was presented to the emergency room and cardiac markers and physical examination was unremarkable. Was called to see the patient. Patient continues to have heaviness in the middle of the chest. She states this as severe when she initially presented, no significant change with sublingual nitroglycerin, but persist to have chest pain. Her husband is present the bedside. Patient states that since being on IV medications, chest pain is better and rates it as 2/10. Patient has history of chronic migraine headaches with aura. She has had headache when she initially presented and has been made worse by sublingual nitroglycerin. She denies hemoptysis, leg edema, symptoms of TIA or claudication. No visual disturbances. She states that her headache is as usual with migraine.  Past Medical History  Diagnosis Date  . S/P laparoscopic cholecystectomy   . Kidney stones   . ADHD (attention deficit hyperactivity disorder)   . Depression     Past Surgical History  Procedure Laterality Date  . Cholecystectomy    . Abdominal hysterectomy      No family history on file. Social History:  reports that she has been smoking.  She does not have any smokeless tobacco history on file. She reports that she does not drink alcohol or use illicit drugs.  Allergies:  Allergies  Allergen Reactions  . Penicillins Anaphylaxis  . Ciprofloxacin Hives and Itching  . Ibuprofen Other (See  Comments)    Upsets my stomach  . Macrobid (Nitrofurantoin Macrocrystal) Other (See Comments)    Unknown   . Vyvanse (Lisdexamfetamine Dimesylate) Other (See Comments)    Makes me feel jittery    Medications Prior to Admission  Medication Sig Dispense Refill  . DULoxetine (CYMBALTA) 60 MG capsule Take 60 mg by mouth daily.      Marland Kitchen lisdexamfetamine (VYVANSE) 40 MG capsule Take 40 mg by mouth every morning.        Review of Systems - patient has been having nausea since onset of chest discomfort. No vomiting. She has been having mild lower back pain for the past few days on and off. No fever, no chills, no recent weight changes. He denies any neurological weaknesses. She's not a diabetic. Other systems are negative.  Blood pressure 127/62, pulse 80, temperature 98.2 F (36.8 C), temperature source Oral, resp. rate 22, height 5' 6.93" (1.7 m), weight 84 kg (185 lb 3 oz), SpO2 97.00%. General appearance: alert, cooperative, appears older than stated age, no distress and moderately obese Eyes: Full range of movements, pupils are equal Neck: no adenopathy, no carotid bruit, no JVD, supple, symmetrical, trachea midline and thyroid not enlarged, symmetric, no tenderness/mass/nodules Neck: JVP - normal, carotids 2+= without bruits Resp: clear to auscultation bilaterally Chest wall: no tenderness Cardio: regular rate and rhythm, S1, S2 normal, no murmur, click, rub or gallop GI: soft, non-tender; bowel sounds normal; no masses,  no organomegaly Extremities: extremities normal, atraumatic, no cyanosis or edema Pulses: 2+ and symmetric Skin: Skin  color, texture, turgor normal. No rashes or lesions Neurologic: Grossly normal  Results for orders placed during the hospital encounter of 07/04/12 (from the past 48 hour(s))  CBC WITH DIFFERENTIAL     Status: Abnormal   Collection Time    07/04/12  3:45 AM      Result Value Range   WBC 9.0  4.0 - 10.5 K/uL   RBC 3.70 (*) 3.87 - 5.11 MIL/uL    Hemoglobin 12.3  12.0 - 15.0 g/dL   HCT 16.1 (*) 09.6 - 04.5 %   MCV 93.5  78.0 - 100.0 fL   MCH 33.2  26.0 - 34.0 pg   MCHC 35.5  30.0 - 36.0 g/dL   RDW 40.9  81.1 - 91.4 %   Platelets 273  150 - 400 K/uL   Neutrophils Relative % 52  43 - 77 %   Neutro Abs 4.7  1.7 - 7.7 K/uL   Lymphocytes Relative 36  12 - 46 %   Lymphs Abs 3.3  0.7 - 4.0 K/uL   Monocytes Relative 9  3 - 12 %   Monocytes Absolute 0.8  0.1 - 1.0 K/uL   Eosinophils Relative 3  0 - 5 %   Eosinophils Absolute 0.3  0.0 - 0.7 K/uL   Basophils Relative 0  0 - 1 %   Basophils Absolute 0.0  0.0 - 0.1 K/uL  POCT I-STAT TROPONIN I     Status: None   Collection Time    07/04/12  3:57 AM      Result Value Range   Troponin i, poc 0.00  0.00 - 0.08 ng/mL   Comment 3            Comment: Due to the release kinetics of cTnI,     a negative result within the first hours     of the onset of symptoms does not rule out     myocardial infarction with certainty.     If myocardial infarction is still suspected,     repeat the test at appropriate intervals.  POCT I-STAT, CHEM 8     Status: Abnormal   Collection Time    07/04/12  3:59 AM      Result Value Range   Sodium 138  135 - 145 mEq/L   Potassium 3.9  3.5 - 5.1 mEq/L   Chloride 100  96 - 112 mEq/L   BUN 3 (*) 6 - 23 mg/dL   Creatinine, Ser 7.82  0.50 - 1.10 mg/dL   Glucose, Bld 956 (*) 70 - 99 mg/dL   Calcium, Ion 2.13  0.86 - 1.23 mmol/L   TCO2 27  0 - 100 mmol/L   Hemoglobin 12.2  12.0 - 15.0 g/dL   HCT 57.8  46.9 - 62.9 %  URINALYSIS, ROUTINE W REFLEX MICROSCOPIC     Status: None   Collection Time    07/04/12  5:49 AM      Result Value Range   Color, Urine YELLOW  YELLOW   APPearance CLEAR  CLEAR   Specific Gravity, Urine 1.007  1.005 - 1.030   pH 6.0  5.0 - 8.0   Glucose, UA NEGATIVE  NEGATIVE mg/dL   Hgb urine dipstick NEGATIVE  NEGATIVE   Bilirubin Urine NEGATIVE  NEGATIVE   Ketones, ur NEGATIVE  NEGATIVE mg/dL   Protein, ur NEGATIVE  NEGATIVE mg/dL    Urobilinogen, UA 0.2  0.0 - 1.0 mg/dL   Nitrite NEGATIVE  NEGATIVE   Leukocytes, UA  NEGATIVE  NEGATIVE   Comment: MICROSCOPIC NOT DONE ON URINES WITH NEGATIVE PROTEIN, BLOOD, LEUKOCYTES, NITRITE, OR GLUCOSE <1000 mg/dL.  POCT I-STAT TROPONIN I     Status: None   Collection Time    07/04/12  6:03 AM      Result Value Range   Troponin i, poc 0.00  0.00 - 0.08 ng/mL   Comment 3            Comment: Due to the release kinetics of cTnI,     a negative result within the first hours     of the onset of symptoms does not rule out     myocardial infarction with certainty.     If myocardial infarction is still suspected,     repeat the test at appropriate intervals.  CBC     Status: Abnormal   Collection Time    07/04/12  6:40 AM      Result Value Range   WBC 7.7  4.0 - 10.5 K/uL   RBC 3.58 (*) 3.87 - 5.11 MIL/uL   Hemoglobin 11.8 (*) 12.0 - 15.0 g/dL   HCT 16.1 (*) 09.6 - 04.5 %   MCV 94.1  78.0 - 100.0 fL   MCH 33.0  26.0 - 34.0 pg   MCHC 35.0  30.0 - 36.0 g/dL   RDW 40.9  81.1 - 91.4 %   Platelets 247  150 - 400 K/uL   Dg Chest 2 View  07/04/2012   *RADIOLOGY REPORT*  Clinical Data: Left-sided chest pain.  CHEST - 2 VIEW  Comparison: 11/21/2010.  Findings: The cardiac silhouette, mediastinal and hilar contours are normal and stable.  The lungs are clear.  No pleural effusion. The bony thorax is intact.  IMPRESSION: No acute cardiopulmonary findings.   Original Report Authenticated By: Rudie Meyer, M.D.    Labs:   Lab Results  Component Value Date   WBC 7.7 07/04/2012   HGB 11.8* 07/04/2012   HCT 33.7* 07/04/2012   MCV 94.1 07/04/2012   PLT 247 07/04/2012    Recent Labs Lab 07/04/12 0359  NA 138  K 3.9  CL 100  BUN 3*  CREATININE 0.70  GLUCOSE 103*   Lab Results  Component Value Date   CKTOTAL 47 11/21/2010   CKMB 1.5 11/21/2010   TROPONINI <0.30 11/21/2010    Lipid Panel     Component Value Date/Time   CHOL 269* 11/21/2010 0228   TRIG 223* 11/21/2010 0228   HDL 29*  11/21/2010 0228   CHOLHDL 9.3 11/21/2010 0228   VLDL 45* 11/21/2010 0228   LDLCALC 195* 11/21/2010 0228    EKG: normal EKG, normal sinus rhythm, unchanged from previous tracings.   Assessment/Plan 1. Chest pain suggestive of unstable angina pectoris. 2. Shortness of breath and dyspnea on exertion 3. Tobacco use disorder, patient having difficulty quitting smoking 4. Migraine headaches with aura  Recommendation: Patient's rotation is suggestive of unstable angina, she does have multiple cardiovascular risk factors, due to ongoing chest discomfort although EKG and physical examination are unremarkable, we'll proceed with cardiac catheterization. Husband present at the bedside and have explained to them regarding risks, benefits, alternatives to cardiac catheterization including stress testing. They understand less than 1% risk of that, stroke, MI, urgently for CABG, bleeding, infection but not limited to these. Further conditions will follow following cardiac catheterization.  Pamella Pert, MD 07/04/2012, 7:30 AM [facility] [place] Pager: [phone] Office: [phone] If no answer: Cell:  [phone]

## 2012-07-04 NOTE — ED Notes (Signed)
Pt states she was admitted a while ago for past chest pain

## 2012-07-04 NOTE — ED Notes (Signed)
Phlebotomy at bedside.

## 2012-07-04 NOTE — ED Provider Notes (Signed)
Medical screening examination/treatment/procedure(s) were performed by non-physician practitioner and as supervising physician I was immediately available for consultation/collaboration.  Jasmine Awe, MD 07/04/12 [phone]

## 2012-07-04 NOTE — CV Procedure (Addendum)
Procedure performed:  Left heart catheterization including hemodynamic monitoring of the left ventricle, LV gram, selective right and left coronary arteriography.  Indication patient is a 47 year-old female with history of hyperlipidemia, tobacco use disorder  who presents with chest pain to the emergency department. Due to persistent chest discomfort she was brought to the cardiac catheterization lab to evaluate her coronary anatomy. She also complains of shortness of breath. Hence is brought to the cardiac catheterization lab to evaluate the  coronary anatomy for definitive diagnosis of CAD.  Hemodynamic data:  Left ventricular pressure was 129/14 with LVEDP of 26 mm mercury. Aortic pressure was 128/79 with a mean of 101 mm mercury. There was no pressure gradient across the aortic valve  Left ventricle: Performed in the RAO projection revealed LVEF of 60%. There was No MR. No wall motion abnormality.  Right coronary artery: The vessel is smooth, normal,  Dominant. There was severe catheter-induced spasm in the proximal and midsegment. This was read with 400 mcg of intracoronary nitroglycerin administration revealing completely normal coronary artery.  Left main coronary artery is large and normal.  Circumflex coronary artery: A large vessel giving origin to a large obtuse marginal 1. It is smooth and normal.   LAD:  LAD gives origin to a large diagonal-1.  Normal  Technique: Under sterile precautions using a 6 French right radial  arterial access, a 6 French sheath was introduced into the right radial artery. A 5 [place] Tig 4 catheter was advanced into the ascending aorta selective  right coronary artery and left coronary artery was cannulated and angiography was performed in multiple views. The catheter was pulled back Out of the body over exchange length J-wire. 5 French pigtail Catheter was used to perform LV gram which was performed in RAO projection.  Catheter exchanged out of the body  over J-Wire. NO immediate complications noted. Patient tolerated the procedure well. Hemostasis was obtained by applying TR band.  Rec: Medical therapy with aggressive risk factor reduction. Patient has significant symptoms of GERD, I will also start her on PPI. I will see her back in the office 2 weeks. She'll need smoking cessation therapy. Patient is comfortable to be discharged home. Lipid profile 07/04/2012: Total cholesterol: 58, triglycerides 232, HDL 39, LDL was 173. I have also started her on Lipitor 40 mg by mouth daily.  Disposition: Will be discharged home today with outpatient follow up.

## 2012-07-11 NOTE — Discharge Summary (Signed)
Physician Discharge Summary  Patient ID: Mia Smith MRN: [id number] DOB/AGE: Jun 08, 1965 47 y.o.  Admit date: 07/04/2012 Discharge date: 07/04/2012  Primary Discharge Diagnosis Chest pain due to GERD Secondary Discharge Diagnosis Hyperlipidemia Tobacco use disorder Migraine Headaches  Significant Diagnostic Studies: cardiac catheterizationon 07/04/2012: Normal coronary arteries, normal left ventricle systolic function.  Hospital Course: Mia Smith is a 47 year old Caucasian female with history of hyperlipidemia, one pack of cigarette per day use, who I had seen in 2012 when she presented with chest pain and heaviness in the middle of the chest. She was evaluated in the emergency room, discharged home after ruling out myocardial infarction with normal EKG. She had been doing well and last night around 1 or 2 AM, she started having chest heaviness along with shortness of breath and nausea. She was presented to the emergency room and cardiac markers and physical examination was unremarkable. She was taken to the cardiac catheterization lab directly from the emergency room.   At that time she was found to have normal coronary arteries.  She ruled out for myocardial infarction by serial cardiac markers.  Hence felt stable for discharge with outpatient follow-up.   Recommendations on discharge: patient discharged home on Protonix, she will also continue with Lipitor for hyperlipidemia.  I will see her back in the office in 2 weeks.  Discharge Exam: Blood pressure 127/62, pulse 69, temperature 98.2 F (36.8 C), temperature source Oral, resp. rate 22, height 5' 6.93" (1.7 m), weight 84 kg (185 lb 3 oz), SpO2 97.00%.   General appearance: alert, cooperative, appears older than stated age, no distress and moderately obese  Eyes: Full range of movements, pupils are equal  Neck: no adenopathy, no carotid bruit, no JVD, supple, symmetrical, trachea midline and thyroid not enlarged, symmetric,  no tenderness/mass/nodules  Neck: JVP - normal, carotids 2+= without bruits  Resp: clear to auscultation bilaterally  Chest wall: no tenderness  Cardio: regular rate and rhythm, S1, S2 normal, no murmur, click, rub or gallop  GI: soft, non-tender; bowel sounds normal; no masses, no organomegaly  Extremities: extremities normal, atraumatic, no cyanosis or edema  Pulses: 2+ and symmetric  Skin: Skin color, texture, turgor normal. No rashes or lesions  Neurologic: Grossly normal   Labs:   Lab Results  Component Value Date   WBC 7.7 07/04/2012   HGB 11.8* 07/04/2012   HCT 33.7* 07/04/2012   MCV 94.1 07/04/2012   PLT 247 07/04/2012   No results found for this basename: NA, K, CL, CO2, BUN, CREATININE, CALCIUM, LABALBU, PROT, BILITOT, ALKPHOS, ALT, AST, GLUCOSE,  in the last 168 hours Lab Results  Component Value Date   CKTOTAL 47 11/21/2010   CKMB 1.5 11/21/2010   TROPONINI <0.30 11/21/2010    Lipid Panel     Component Value Date/Time   CHOL 258* 07/04/2012 0610   TRIG 232* 07/04/2012 0610   HDL 39* 07/04/2012 0610   CHOLHDL 6.6 07/04/2012 0610   VLDL 46* 07/04/2012 0610   LDLCALC 173* 07/04/2012 0610    EKG: normal EKG, normal sinus rhythm, unchanged from previous tracings.    Radiology: Dg Chest 2 View  07/04/2012   *RADIOLOGY REPORT*  Clinical Data: Left-sided chest pain.  CHEST - 2 VIEW  Comparison: 11/21/2010.  Findings: The cardiac silhouette, mediastinal and hilar contours are normal and stable.  The lungs are clear.  No pleural effusion. The bony thorax is intact.  IMPRESSION: No acute cardiopulmonary findings.   Original Report Authenticated By: Rudie Meyer, M.D.  FOLLOW UP PLANS AND APPOINTMENTS: Dr. Yates Decamp in 2 weeks to 3 weeks unless recurrence of chest pain.      Medication List    TAKE these medications       atorvastatin 40 MG tablet  Commonly known as:  LIPITOR  Take 1 tablet (40 mg total) by mouth daily.     DULoxetine 60 MG capsule  Commonly known  as:  CYMBALTA  Take 60 mg by mouth daily.     lisdexamfetamine 40 MG capsule  Commonly known as:  VYVANSE  Take 40 mg by mouth every morning.     pantoprazole 40 MG tablet  Commonly known as:  PROTONIX  Take 1 tablet (40 mg total) by mouth daily.           Follow-up Information   Follow up with Mia Pert, MD On 07/22/2012. (10 am appointment. To come 15 minutes early and bring all medications)    Contact information:   [address] [place] [address] [phone]        Mia Pert, MD 07/11/2012, 10:00 AM  Pager: [phone] Office: [phone] If no answer: [phone]

## 2013-07-13 ENCOUNTER — Emergency Department: Payer: Self-pay | Admitting: Internal Medicine

## 2013-07-13 LAB — COMPREHENSIVE METABOLIC PANEL
ALBUMIN: 3.9 g/dL (ref 3.4–5.0)
ALK PHOS: 89 U/L
ANION GAP: 4 — AB (ref 7–16)
BILIRUBIN TOTAL: 0.5 mg/dL (ref 0.2–1.0)
BUN: 4 mg/dL — AB (ref 7–18)
CALCIUM: 9.2 mg/dL (ref 8.5–10.1)
CREATININE: 1.05 mg/dL (ref 0.60–1.30)
Chloride: 103 mmol/L (ref 98–107)
Co2: 26 mmol/L (ref 21–32)
EGFR (African American): >60
GLUCOSE: 97 mg/dL (ref 65–99)
OSMOLALITY: 263 (ref 275–301)
Potassium: 2.8 mmol/L — ABNORMAL LOW (ref 3.5–5.1)
SGOT(AST): 27 U/L (ref 15–37)
SGPT (ALT): 19 U/L (ref 12–78)
SODIUM: 133 mmol/L — AB (ref 136–145)
TOTAL PROTEIN: 8.3 g/dL — AB (ref 6.4–8.2)

## 2013-07-13 LAB — URINALYSIS, COMPLETE
Bilirubin,UR: NEGATIVE
GLUCOSE, UR: NEGATIVE mg/dL (ref 0–75)
KETONE: NEGATIVE
LEUKOCYTE ESTERASE: NEGATIVE
NITRITE: NEGATIVE
PH: 6 (ref 4.5–8.0)
Protein: NEGATIVE
RBC,UR: 2 /HPF (ref 0–5)
Specific Gravity: 1.003 (ref 1.003–1.030)
Squamous Epithelial: 6

## 2013-07-13 LAB — CBC
HCT: 47.9 % — AB (ref 35.0–47.0)
HGB: 16.4 g/dL — ABNORMAL HIGH (ref 12.0–16.0)
MCH: 31.8 pg (ref 26.0–34.0)
MCHC: 34.4 g/dL (ref 32.0–36.0)
MCV: 93 fL (ref 80–100)
PLATELETS: 396 10*3/uL (ref 150–440)
RBC: 5.16 10*6/uL (ref 3.80–5.20)
RDW: 14.3 % (ref 11.5–14.5)
WBC: 12.9 10*3/uL — AB (ref 3.6–11.0)

## 2013-07-13 LAB — CLOSTRIDIUM DIFFICILE(ARMC)

## 2013-07-13 LAB — LIPASE, BLOOD: LIPASE: 353 U/L (ref 73–393)

## 2013-07-13 LAB — OCCULT BLOOD X 1 CARD TO LAB, STOOL: OCCULT BLOOD, FECES: NEGATIVE

## 2013-07-16 LAB — STOOL CULTURE

## 2013-07-16 LAB — WBCS, STOOL

## 2014-01-22 ENCOUNTER — Encounter (HOSPITAL_COMMUNITY): Payer: Self-pay | Admitting: Cardiology

## 2014-08-19 IMAGING — CR DG CHEST 2V
2 series · 2 of 2 positions shown · non-contrast
Comparison: 11/21/2010.

CLINICAL DATA: Left-sided chest pain.

CHEST - 2 VIEW

[w chest pa]
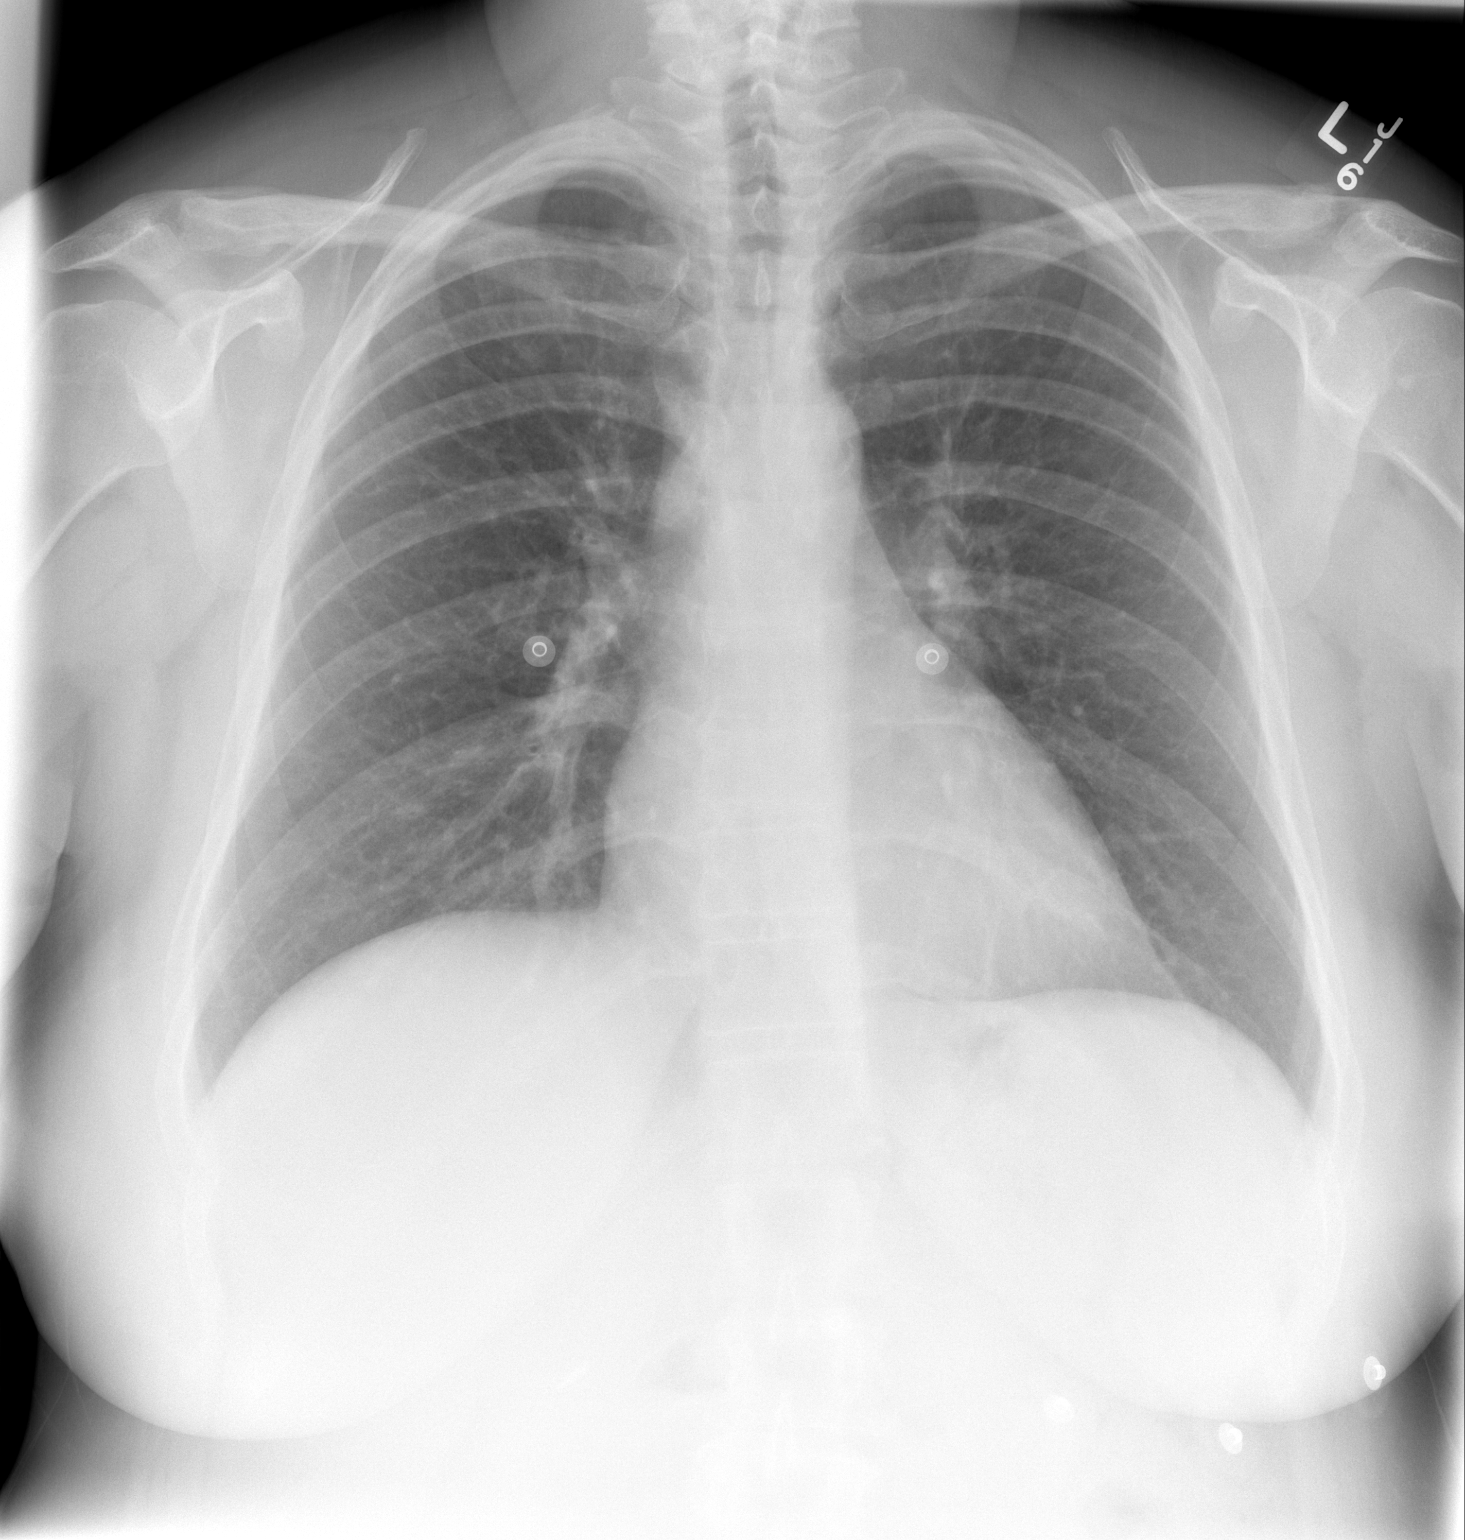

[w chest lat]
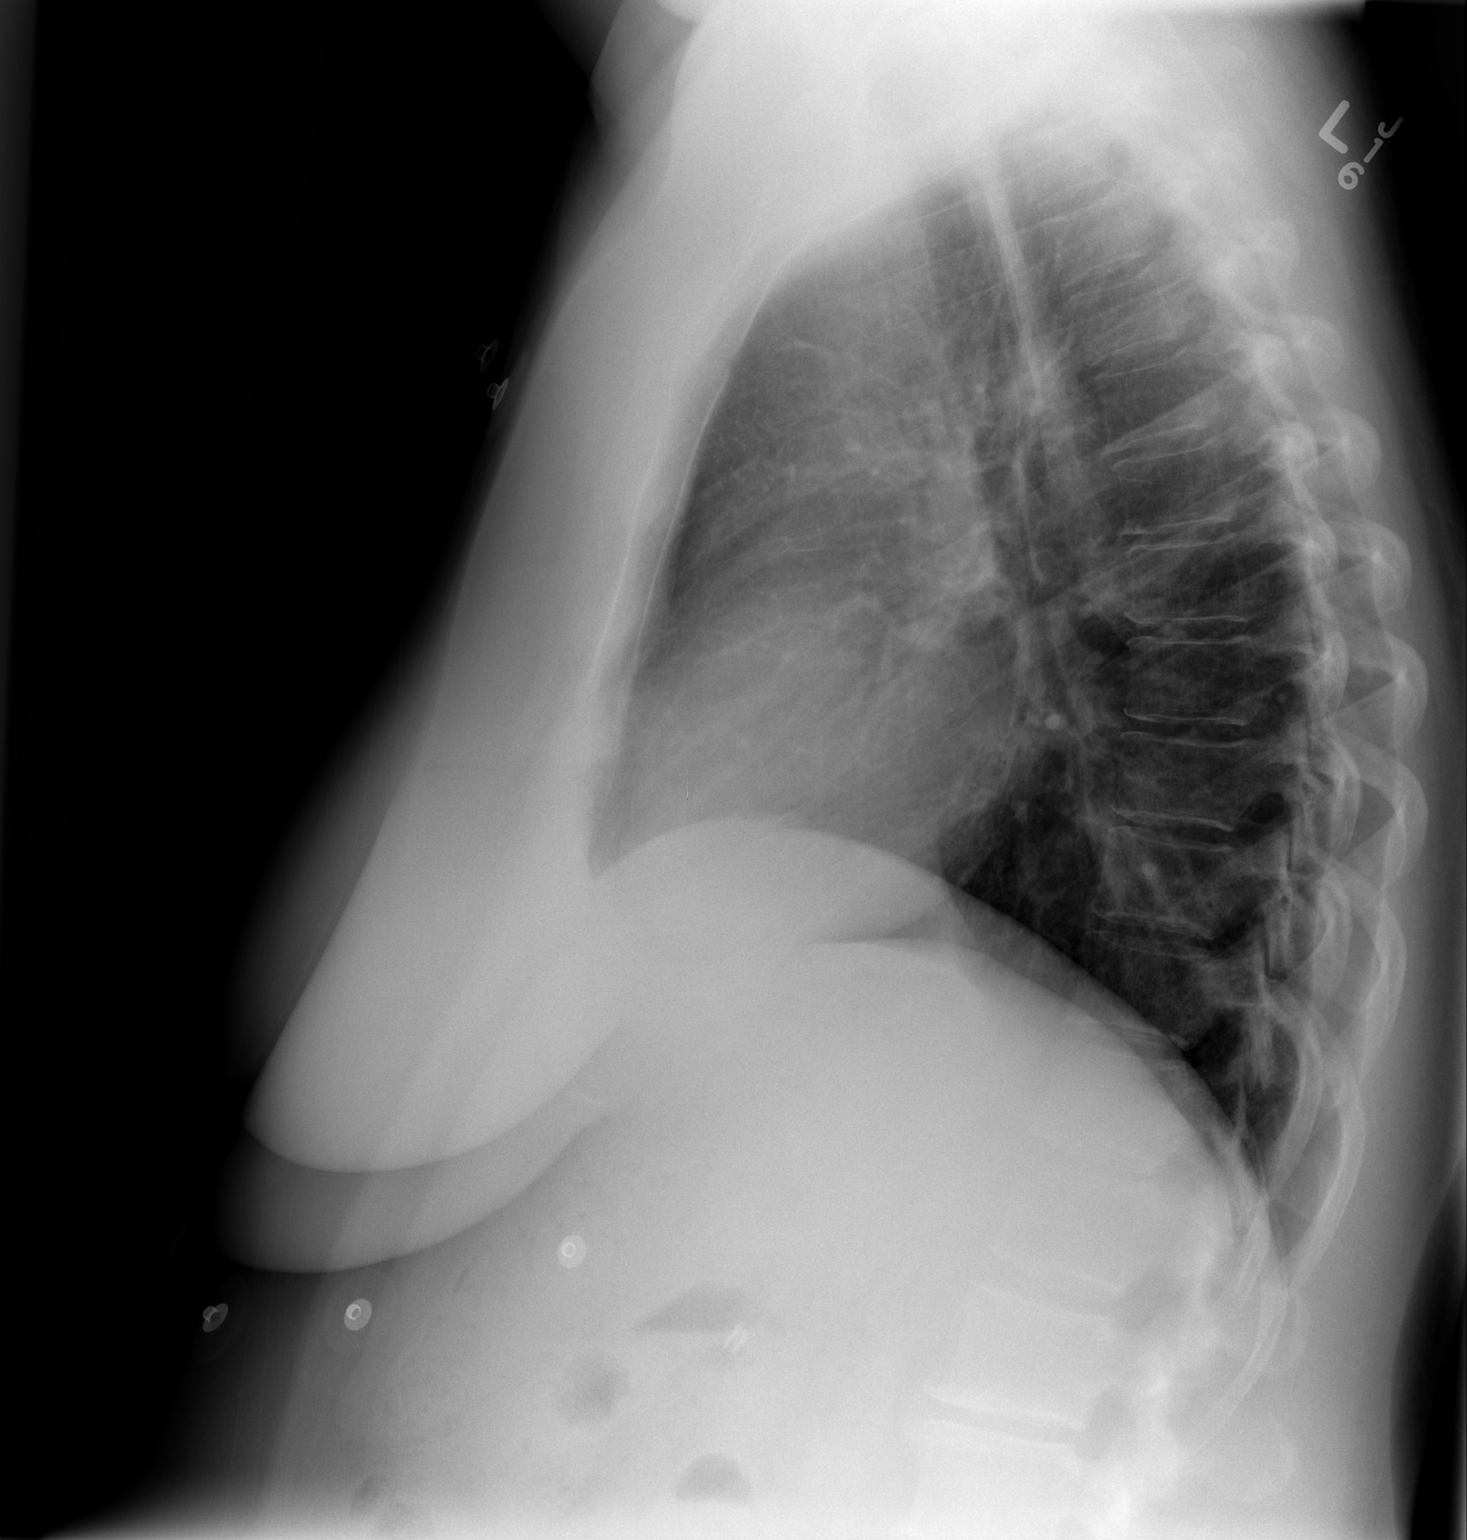

[2 of 2 positions shown; findings below may reference images not displayed]

FINDINGS: The cardiac silhouette, mediastinal and hilar contours
are normal and stable.  The lungs are clear.  No pleural effusion.
The bony thorax is intact.
IMPRESSION: No acute cardiopulmonary findings.

## 2017-10-14 ENCOUNTER — Emergency Department
Admission: EM | Admit: 2017-10-14 | Discharge: 2017-10-14 | Disposition: A | Discharge status: Home / Self Care | Payer: Managed Care, Other (non HMO) | Attending: Emergency Medicine | Admitting: Emergency Medicine

## 2017-10-14 DIAGNOSIS — W57XXXA Bitten or stung by nonvenomous insect and other nonvenomous arthropods, initial encounter: Secondary | ICD-10-CM | POA: Insufficient documentation

## 2017-10-14 DIAGNOSIS — Y999 Unspecified external cause status: Secondary | ICD-10-CM | POA: Insufficient documentation

## 2017-10-14 DIAGNOSIS — F17200 Nicotine dependence, unspecified, uncomplicated: Secondary | ICD-10-CM | POA: Insufficient documentation

## 2017-10-14 DIAGNOSIS — Y929 Unspecified place or not applicable: Secondary | ICD-10-CM | POA: Insufficient documentation

## 2017-10-14 DIAGNOSIS — L0201 Cutaneous abscess of face: Secondary | ICD-10-CM | POA: Insufficient documentation

## 2017-10-14 DIAGNOSIS — Y939 Activity, unspecified: Secondary | ICD-10-CM | POA: Insufficient documentation

## 2017-10-14 DIAGNOSIS — Z23 Encounter for immunization: Secondary | ICD-10-CM | POA: Insufficient documentation

## 2017-10-14 DIAGNOSIS — L0291 Cutaneous abscess, unspecified: Secondary | ICD-10-CM

## 2017-10-14 MED ORDER — TETANUS-DIPHTH-ACELL PERTUSSIS 5-2.5-18.5 LF-MCG/0.5 IM SUSP
0.5000 mL | Freq: Once | INTRAMUSCULAR | Status: AC
  Administered 2017-10-14: 0.5 mL via INTRAMUSCULAR
  Filled 2017-10-14: qty 0.5

## 2017-10-14 MED ORDER — CLINDAMYCIN PHOSPHATE 600 MG/4ML IJ SOLN
600.0000 mg | Freq: Once | INTRAMUSCULAR | Status: AC
  Administered 2017-10-14: 600 mg via INTRAMUSCULAR
  Filled 2017-10-14 (×2): qty 4

## 2017-10-14 MED ORDER — SULFAMETHOXAZOLE-TRIMETHOPRIM 800-160 MG PO TABS: 1.0000 | ORAL_TABLET | Freq: Two times a day (BID) | ORAL | 0 refills | Status: DC

## 2017-10-14 NOTE — ED Triage Notes (Signed)
Pt presents today with a boil/incess on the left cheek bone. Pt states it started Friday and has gotten bigger over the last few days. Pt states she used a new face wash last week and that she has a larger pore on that side of her face and gets boils there time to time just not like this.

## 2017-10-14 NOTE — ED Provider Notes (Signed)
[facility] Emergency Department Provider Note  ____________________________________________  Time seen: Approximately 1:31 PM  I have reviewed the triage vital signs and the nursing notes.   HISTORY  Chief Complaint Insect Bite    HPI Mia Smith is a 52 y.o. female that presents to the emergency department for evaluation of boil to left cheek for 2 days.  She does not recall being bitten by anything.  Area started draining last night.  Unknown last tetanus.  No fever, chills.  Past Medical History:  Diagnosis Date  . ADHD (attention deficit hyperactivity disorder)   . Depression   . Kidney stones   . S/P laparoscopic cholecystectomy     There are no active problems to display for this patient.   Past Surgical History:  Procedure Laterality Date  . ABDOMINAL HYSTERECTOMY    . CHOLECYSTECTOMY    . LEFT HEART CATHETERIZATION WITH CORONARY ANGIOGRAM N/A 07/04/2012   Procedure: LEFT HEART CATHETERIZATION WITH CORONARY ANGIOGRAM;  Surgeon: Pamella Pert, MD;  Location: [facility] CATH LAB;  Service: Cardiovascular;  Laterality: N/A;    Prior to Admission medications   Medication Sig Start Date End Date Taking? Authorizing Provider  atorvastatin (LIPITOR) 40 MG tablet Take 1 tablet (40 mg total) by mouth daily. 07/04/12   Yates Decamp, MD  DULoxetine (CYMBALTA) 60 MG capsule Take 60 mg by mouth daily.    [provider]  lisdexamfetamine (VYVANSE) 40 MG capsule Take 40 mg by mouth every morning.    [provider]  pantoprazole (PROTONIX) 40 MG tablet Take 1 tablet (40 mg total) by mouth daily. 07/04/12   Yates Decamp, MD  sulfamethoxazole-trimethoprim (BACTRIM DS,SEPTRA DS) 800-160 MG tablet Take 1 tablet by mouth 2 (two) times daily. 10/14/17   Enid Derry, PA-C    Allergies Penicillins; Ciprofloxacin; Ibuprofen; Macrobid [nitrofurantoin macrocrystal]; and Vyvanse [lisdexamfetamine dimesylate]  No family history on file.  Social  History Social History   Tobacco Use  . Smoking status: Current Every Day Smoker  Substance Use Topics  . Alcohol use: No  . Drug use: No     Review of Systems  Constitutional: No fever/chills Cardiovascular: No chest pain. Respiratory:  No SOB. Gastrointestinal: No abdominal pain.  No nausea, no vomiting.  Musculoskeletal: Negative for musculoskeletal pain. Skin: Negative for abrasions, lacerations, ecchymosis. Neurological: Negative for headaches   ____________________________________________   PHYSICAL EXAM:  VITAL SIGNS: ED Triage Vitals  Enc Vitals Group     BP 10/14/17 1216 (!) 123/49     Pulse Rate 10/14/17 1216 70     Resp 10/14/17 1216 18     Temp 10/14/17 1216 98 F (36.7 C)     Temp Source 10/14/17 1216 Oral     SpO2 10/14/17 1216 100 %     Weight 10/14/17 1217 155 lb (70.3 kg)     Height 10/14/17 1217 5\' 7"  (1.702 m)     Head Circumference --      Peak Flow --      Pain Score 10/14/17 1217 2     Pain Loc --      Pain Edu? --      Excl. in GC? --      Constitutional: Alert and oriented. Well appearing and in no acute distress. Eyes: Conjunctivae are normal. PERRL. EOMI. Head: 1 cm x 1 cm fluctuant mass to outside of left eye that is actively draining yellow purulent drainage from center. ENT:      Ears:  Nose: No congestion/rhinnorhea.      Mouth/Throat: Mucous membranes are moist.  Neck: No stridor.  Cardiovascular: Normal rate, regular rhythm.  Good peripheral circulation. Respiratory: Normal respiratory effort without tachypnea or retractions. Lungs CTAB. Good air entry to the bases with no decreased or absent breath sounds. Musculoskeletal: Full range of motion to all extremities. No gross deformities appreciated. Neurologic:  Normal speech and language. No gross focal neurologic deficits are appreciated.  Skin:  Skin is warm, dry and intact. Psychiatric: Mood and affect are normal. Speech and behavior are normal. Patient exhibits  appropriate insight and judgement.   ____________________________________________   LABS (all labs ordered are listed, but only abnormal results are displayed)  Labs Reviewed - No data to display ____________________________________________  EKG   ____________________________________________  RADIOLOGY   No results found.  ____________________________________________    PROCEDURES  Procedure(s) performed:    Procedures    Medications  clindamycin (CLEOCIN) injection 600 mg (600 mg Intramuscular Given 10/14/17 1350)  Tdap (BOOSTRIX) injection 0.5 mL (0.5 mLs Intramuscular Given 10/14/17 1351)     ____________________________________________   INITIAL IMPRESSION / ASSESSMENT AND PLAN / ED COURSE  Pertinent labs & imaging results that were available during my care of the patient were reviewed by me and considered in my medical decision making (see chart for details).  Review of the [place] CSRS was performed in accordance of the [facility] prior to dispensing any controlled drugs.     Patient's diagnosis is consistent with abscess. Vital signs and exam are reassuring. Abscess is actively draining. Patient states that last time she had clindamycin, it was hard on her stomach so she was given a shot of clinda here and was discharged with Bactrim. Patient will be discharged home with prescriptions for Bactrim. Patient is to follow up with wound care or PCP as directed. Patient is given ED precautions to return to the ED for any worsening or new symptoms.     ____________________________________________  FINAL CLINICAL IMPRESSION(S) / ED DIAGNOSES  Final diagnoses:  Abscess      NEW MEDICATIONS STARTED DURING THIS VISIT:  ED Discharge Orders         Ordered    sulfamethoxazole-trimethoprim (BACTRIM DS,SEPTRA DS) 800-160 MG tablet  2 times daily     10/14/17 1340              This chart was dictated using voice recognition software/Dragon. Despite best  efforts to proofread, errors can occur which can change the meaning. Any change was purely unintentional.    Enid Derry, PA-C 10/14/17 1611    Arnaldo Natal, MD 10/22/17 0030

## 2018-06-09 ENCOUNTER — Other Ambulatory Visit: Payer: Self-pay

## 2018-06-09 ENCOUNTER — Emergency Department
Admission: EM | Admit: 2018-06-09 | Discharge: 2018-06-09 | Disposition: A | Discharge status: Home / Self Care | Payer: 59 | Attending: Emergency Medicine | Admitting: Emergency Medicine

## 2018-06-09 ENCOUNTER — Encounter: Payer: Self-pay | Admitting: Emergency Medicine

## 2018-06-09 DIAGNOSIS — H1032 Unspecified acute conjunctivitis, left eye: Secondary | ICD-10-CM

## 2018-06-09 DIAGNOSIS — H5710 Ocular pain, unspecified eye: Secondary | ICD-10-CM | POA: Diagnosis present

## 2018-06-09 MED ORDER — TOBRAMYCIN-DEXAMETHASONE 0.3-0.1 % OP SUSP: 2.0000 [drp] | OPHTHALMIC | 0 refills | Status: AC

## 2018-06-09 NOTE — Discharge Instructions (Addendum)
Follow-up with your regular doctor if not better in 3 days.  Return emergency department worsening.  Use medication as prescribed.  Do not touch her eye.  Wash your hands if you do touch her eye.

## 2018-06-09 NOTE — ED Notes (Signed)
NAD noted at time of D/C. Pt denies questions or concerns. Pt ambulatory to the lobby at this time. Good RX prescription card provided to patient at time of D/C.

## 2018-06-09 NOTE — ED Triage Notes (Signed)
Pt to ED via POV c/o bilateral eye pain, left worse than right. Pt is in NAD.

## 2018-06-09 NOTE — ED Provider Notes (Signed)
[facility] Emergency Department Provider Note  ____________________________________________   First MD Initiated Contact with Patient 06/09/18 [phone]     (approximate)  I have reviewed the triage vital signs and the nursing notes.   HISTORY  Chief Complaint Eye Pain    HPI Mia Smith is a 53 y.o. female presents emergency department complaint of bilateral eye pain.  She states she awoke this morning with the left eye being red and swollen with crusting and matting.  She had to use a wet compress to remove the matting.  She denies any fever or chills.  No change in vision other than it is blurred due to the drainage.  No history of glaucoma.    Past Medical History:  Diagnosis Date  . ADHD (attention deficit hyperactivity disorder)   . Depression   . Kidney stones   . S/P laparoscopic cholecystectomy     There are no active problems to display for this patient.   Past Surgical History:  Procedure Laterality Date  . ABDOMINAL HYSTERECTOMY    . CHOLECYSTECTOMY    . LEFT HEART CATHETERIZATION WITH CORONARY ANGIOGRAM N/A 07/04/2012   Procedure: LEFT HEART CATHETERIZATION WITH CORONARY ANGIOGRAM;  Surgeon: Pamella Pert, MD;  Location: [facility] CATH LAB;  Service: Cardiovascular;  Laterality: N/A;    Prior to Admission medications   Medication Sig Start Date End Date Taking? Authorizing Provider  atorvastatin (LIPITOR) 40 MG tablet Take 1 tablet (40 mg total) by mouth daily. 07/04/12   Yates Decamp, MD  DULoxetine (CYMBALTA) 60 MG capsule Take 60 mg by mouth daily.    [provider]  lisdexamfetamine (VYVANSE) 40 MG capsule Take 40 mg by mouth every morning.    [provider]  pantoprazole (PROTONIX) 40 MG tablet Take 1 tablet (40 mg total) by mouth daily. 07/04/12   Yates Decamp, MD  tobramycin-dexamethasone [facility]) ophthalmic solution Place 2 drops into both eyes every 4 (four) hours while awake. 06/09/18   Faythe Ghee, PA-C    Allergies Penicillins; Ciprofloxacin; Ibuprofen; Macrobid [nitrofurantoin macrocrystal]; and Vyvanse [lisdexamfetamine dimesylate]  No family history on file.  Social History Social History   Tobacco Use  . Smoking status: Current Every Day Smoker  Substance Use Topics  . Alcohol use: No  . Drug use: No    Review of Systems  Constitutional: No fever/chills Eyes: No visual changes.  Positive for left eye pain and drainage, positive for matting ENT: No sore throat. Respiratory: Denies cough Genitourinary: Negative for dysuria. Musculoskeletal: Negative for back pain. Skin: Negative for rash.    ____________________________________________   PHYSICAL EXAM:  VITAL SIGNS: ED Triage Vitals  Enc Vitals Group     BP 06/09/18 0830 (!) 161/103     Pulse Rate 06/09/18 0830 64     Resp 06/09/18 0830 18     Temp 06/09/18 0830 97.6 F (36.4 C)     Temp Source 06/09/18 0830 Oral     SpO2 06/09/18 0830 96 %     Weight --      Height --      Head Circumference --      Peak Flow --      Pain Score 06/09/18 0827 8     Pain Loc --      Pain Edu? --      Excl. in GC? --     Constitutional: Alert and oriented. Well appearing and in no acute distress. Eyes: Conjunctiva of the left eye is injected,  positive clear drainage at this time, crusting noted on the lashes Head: Atraumatic. Nose: No congestion/rhinnorhea. Mouth/Throat: Mucous membranes are moist.   Neck:  supple no lymphadenopathy noted Cardiovascular: Normal rate, regular rhythm. Heart sounds are normal Respiratory: Normal respiratory effort.  No retractions, lungs c t a  GU: deferred Musculoskeletal: FROM all extremities, warm and well perfused Neurologic:  Normal speech and language.  Skin:  Skin is warm, dry and intact. No rash noted. Psychiatric: Mood and affect are normal. Speech and behavior are normal.  ____________________________________________   LABS (all labs ordered are listed, but only abnormal  results are displayed)  Labs Reviewed - No data to display ____________________________________________   ____________________________________________  RADIOLOGY    ____________________________________________   PROCEDURES  Procedure(s) performed: No  Procedures    ____________________________________________   INITIAL IMPRESSION / ASSESSMENT AND PLAN / ED COURSE  Pertinent labs & imaging results that were available during my care of the patient were reviewed by me and considered in my medical decision making (see chart for details).   Patient is 53 year old female presents emergency department complaining of left eye pain.  She states eyes red and matted.  Positive drainage.  No fever or chills.  Physical exam shows the conjunctivae to be bright red.  Some matting noted on the lashes.  Clear drainage noted at this time.  Explained the findings to the patient.  She is to follow with regular doctor.  She is worsening she is to return emergency department.  She is given prescription for TobraDex ophthalmic drops.  Directions were given on how to use the medication.  She is to remain out of work for the next 24 to 48 hours as she is contagious.  She states she understands will comply.  She is discharged stable condition.     As part of my medical decision making, I reviewed the following data within the electronic MEDICAL RECORD NUMBER Nursing notes reviewed and incorporated, Notes from prior ED visits and [place] Controlled Substance Database  ____________________________________________   FINAL CLINICAL IMPRESSION(S) / ED DIAGNOSES  Final diagnoses:  Acute bacterial conjunctivitis of left eye      NEW MEDICATIONS STARTED DURING THIS VISIT:  New Prescriptions   TOBRAMYCIN-DEXAMETHASONE (TOBRADEX) OPHTHALMIC SOLUTION    Place 2 drops into both eyes every 4 (four) hours while awake.     Note:  This document was prepared using Dragon voice recognition software and may  include unintentional dictation errors.    Faythe GheeFisher, Brynne Doane W, PA-C 06/09/18 [phone]    Nita SickleVeronese, [place], MD 06/09/18 [phone]
